# Patient Record
Sex: Female | Born: 1975 | Race: White | Hispanic: No | Marital: Married | Smoking: Never smoker
Health system: Southern US, Community
[De-identification: ages and names within clinical notes are randomized; demographics above are authoritative.]

## PROBLEM LIST (undated history)

## (undated) DIAGNOSIS — E782 Mixed hyperlipidemia: Secondary | ICD-10-CM

## (undated) DIAGNOSIS — Z8719 Personal history of other diseases of the digestive system: Secondary | ICD-10-CM

## (undated) DIAGNOSIS — Z8742 Personal history of other diseases of the female genital tract: Secondary | ICD-10-CM

## (undated) DIAGNOSIS — F419 Anxiety disorder, unspecified: Secondary | ICD-10-CM

## (undated) DIAGNOSIS — T7840XA Allergy, unspecified, initial encounter: Secondary | ICD-10-CM

## (undated) DIAGNOSIS — K219 Gastro-esophageal reflux disease without esophagitis: Secondary | ICD-10-CM

## (undated) DIAGNOSIS — Z789 Other specified health status: Secondary | ICD-10-CM

## (undated) DIAGNOSIS — I341 Nonrheumatic mitral (valve) prolapse: Secondary | ICD-10-CM

## (undated) DIAGNOSIS — R011 Cardiac murmur, unspecified: Secondary | ICD-10-CM

## (undated) HISTORY — DX: Gastro-esophageal reflux disease without esophagitis: K21.9

## (undated) HISTORY — PX: WISDOM TOOTH EXTRACTION: SHX21

## (undated) HISTORY — DX: Anxiety disorder, unspecified: F41.9

## (undated) HISTORY — DX: Cardiac murmur, unspecified: R01.1

## (undated) HISTORY — DX: Personal history of other diseases of the female genital tract: Z87.42

## (undated) HISTORY — DX: Allergy, unspecified, initial encounter: T78.40XA

## (undated) HISTORY — DX: Mixed hyperlipidemia: E78.2

## (undated) HISTORY — DX: Personal history of other diseases of the digestive system: Z87.19

---

## 1997-11-23 ENCOUNTER — Other Ambulatory Visit: Admission: RE | Admit: 1997-11-23 | Discharge: 1997-11-23 | Payer: Self-pay | Admitting: Gynecology

## 1999-01-10 ENCOUNTER — Other Ambulatory Visit: Admission: RE | Admit: 1999-01-10 | Discharge: 1999-01-10 | Payer: Self-pay | Admitting: Gynecology

## 2000-01-25 ENCOUNTER — Other Ambulatory Visit: Admission: RE | Admit: 2000-01-25 | Discharge: 2000-01-25 | Payer: Self-pay | Admitting: Gynecology

## 2001-04-07 ENCOUNTER — Other Ambulatory Visit: Admission: RE | Admit: 2001-04-07 | Discharge: 2001-04-07 | Payer: Self-pay | Admitting: Gynecology

## 2002-04-28 ENCOUNTER — Other Ambulatory Visit: Admission: RE | Admit: 2002-04-28 | Discharge: 2002-04-28 | Payer: Self-pay | Admitting: Obstetrics and Gynecology

## 2002-05-22 ENCOUNTER — Ambulatory Visit (HOSPITAL_COMMUNITY): Admission: RE | Admit: 2002-05-22 | Discharge: 2002-05-22 | Payer: Self-pay | Admitting: Obstetrics and Gynecology

## 2002-05-22 ENCOUNTER — Encounter: Payer: Self-pay | Admitting: Obstetrics and Gynecology

## 2003-06-28 ENCOUNTER — Inpatient Hospital Stay (HOSPITAL_COMMUNITY): Admission: AD | Admit: 2003-06-28 | Discharge: 2003-06-30 | Payer: Self-pay | Admitting: Obstetrics and Gynecology

## 2003-08-10 ENCOUNTER — Other Ambulatory Visit: Admission: RE | Admit: 2003-08-10 | Discharge: 2003-08-10 | Payer: Self-pay | Admitting: Obstetrics and Gynecology

## 2004-10-20 ENCOUNTER — Other Ambulatory Visit: Admission: RE | Admit: 2004-10-20 | Discharge: 2004-10-20 | Payer: Self-pay | Admitting: Obstetrics and Gynecology

## 2006-02-22 ENCOUNTER — Inpatient Hospital Stay (HOSPITAL_COMMUNITY): Admission: AD | Admit: 2006-02-22 | Discharge: 2006-02-25 | Payer: Self-pay | Admitting: Obstetrics and Gynecology

## 2006-03-20 ENCOUNTER — Ambulatory Visit: Admission: RE | Admit: 2006-03-20 | Discharge: 2006-03-20 | Payer: Self-pay | Admitting: Obstetrics and Gynecology

## 2011-08-06 LAB — OB RESULTS CONSOLE ABO/RH: RH Type: POSITIVE

## 2011-08-06 LAB — OB RESULTS CONSOLE HIV ANTIBODY (ROUTINE TESTING): HIV: NONREACTIVE

## 2011-08-06 LAB — OB RESULTS CONSOLE HEPATITIS B SURFACE ANTIGEN: Hepatitis B Surface Ag: NEGATIVE

## 2012-02-29 ENCOUNTER — Encounter (HOSPITAL_COMMUNITY): Payer: Self-pay | Admitting: *Deleted

## 2012-02-29 ENCOUNTER — Inpatient Hospital Stay (HOSPITAL_COMMUNITY)
Admission: AD | Admit: 2012-02-29 | Discharge: 2012-03-01 | DRG: 379 | Disposition: A | Discharge status: Home / Self Care | Payer: BC Managed Care – PPO | Source: Ambulatory Visit | Attending: Obstetrics and Gynecology | Admitting: Obstetrics and Gynecology

## 2012-02-29 DIAGNOSIS — O4700 False labor before 37 completed weeks of gestation, unspecified trimester: Principal | ICD-10-CM | POA: Diagnosis present

## 2012-02-29 HISTORY — DX: Nonrheumatic mitral (valve) prolapse: I34.1

## 2012-02-29 HISTORY — DX: Other specified health status: Z78.9

## 2012-02-29 NOTE — MAU Note (Signed)
Been uncomfortable last couple days. Ctxs started about 2000.

## 2012-02-29 NOTE — MAU Note (Signed)
To recheck pt in 1.5 hr and discharge home if no change in cervix

## 2012-03-01 ENCOUNTER — Encounter (HOSPITAL_COMMUNITY): Payer: Self-pay | Admitting: *Deleted

## 2012-03-01 LAB — CBC
MCH: 28.3 pg (ref 26.0–34.0)
MCHC: 33.6 g/dL (ref 30.0–36.0)
MCV: 84.2 fL (ref 78.0–100.0)
Platelets: 157 10*3/uL (ref 150–400)
RBC: 3.99 MIL/uL (ref 3.87–5.11)
RDW: 14.5 % (ref 11.5–15.5)

## 2012-03-01 LAB — RPR: RPR Ser Ql: NONREACTIVE

## 2012-03-01 MED ORDER — LACTATED RINGERS IV SOLN: 500.0000 mL | INTRAVENOUS | Status: DC | PRN

## 2012-03-01 MED ORDER — CITRIC ACID-SODIUM CITRATE 334-500 MG/5ML PO SOLN: 30.0000 mL | ORAL | Status: DC | PRN

## 2012-03-01 MED ORDER — IBUPROFEN 600 MG PO TABS: 600.0000 mg | ORAL_TABLET | Freq: Four times a day (QID) | ORAL | Status: DC | PRN

## 2012-03-01 MED ORDER — FLEET ENEMA 7-19 GM/118ML RE ENEM: 1.0000 | ENEMA | Freq: Once | RECTAL | Status: DC

## 2012-03-01 MED ORDER — PENICILLIN G POTASSIUM 5000000 UNITS IJ SOLR
5.0000 10*6.[IU] | Freq: Once | INTRAVENOUS | Status: AC
  Administered 2012-03-01: 5 10*6.[IU] via INTRAVENOUS
  Filled 2012-03-01: qty 5

## 2012-03-01 MED ORDER — OXYTOCIN BOLUS FROM INFUSION: 500.0000 mL | INTRAVENOUS | Status: DC

## 2012-03-01 MED ORDER — OXYCODONE-ACETAMINOPHEN 5-325 MG PO TABS: 1.0000 | ORAL_TABLET | ORAL | Status: DC | PRN

## 2012-03-01 MED ORDER — ONDANSETRON HCL 4 MG/2ML IJ SOLN: 4.0000 mg | Freq: Four times a day (QID) | INTRAMUSCULAR | Status: DC | PRN

## 2012-03-01 MED ORDER — PENICILLIN G POTASSIUM 5000000 UNITS IJ SOLR
2.5000 10*6.[IU] | INTRAMUSCULAR | Status: DC
  Administered 2012-03-01: 2.5 10*6.[IU] via INTRAVENOUS
  Filled 2012-03-01 (×3): qty 2.5

## 2012-03-01 MED ORDER — LIDOCAINE HCL (PF) 1 % IJ SOLN: 30.0000 mL | INTRAMUSCULAR | Status: DC | PRN

## 2012-03-01 MED ORDER — ACETAMINOPHEN 325 MG PO TABS: 650.0000 mg | ORAL_TABLET | ORAL | Status: DC | PRN

## 2012-03-01 MED ORDER — OXYTOCIN 40 UNITS IN LACTATED RINGERS INFUSION - SIMPLE MED: 62.5000 mL/h | INTRAVENOUS | Status: DC

## 2012-03-01 MED ORDER — LACTATED RINGERS IV SOLN
INTRAVENOUS | Status: DC
  Administered 2012-03-01: 02:00:00 via INTRAVENOUS

## 2012-03-01 NOTE — Plan of Care (Signed)
Problem: Consults Goal: Birthing Suites Patient Information Press F2 to bring up selections list Outcome: Completed/Met Date Met:  03/01/12  Pt < [redacted] weeks EGA  Comments:  36.4 wks

## 2012-03-01 NOTE — H&P (Signed)
36 yo G3P2 @ 36+4 wks presents w/ c/o contractions.  No lof or vb.  Good FM.  Pregnancy uncomplicated.  Past History - see hollister  AF, VSS Gen - NAD Abd - gravid, NT Ext - NT, no edema Cvx 2/80/-2 - unchanged x 8 hours  A/P:  Latent labor Discharge home, labor precautions given

## 2012-03-13 ENCOUNTER — Inpatient Hospital Stay (HOSPITAL_COMMUNITY)
Admission: AD | Admit: 2012-03-13 | Discharge: 2012-03-15 | DRG: 373 | Disposition: A | Discharge status: Home / Self Care | Payer: BC Managed Care – PPO | Source: Ambulatory Visit | Attending: Obstetrics and Gynecology | Admitting: Obstetrics and Gynecology

## 2012-03-13 DIAGNOSIS — O09529 Supervision of elderly multigravida, unspecified trimester: Secondary | ICD-10-CM | POA: Diagnosis present

## 2012-03-13 MED ORDER — OXYTOCIN 10 UNIT/ML IJ SOLN
10.0000 [IU] | Freq: Once | INTRAMUSCULAR | Status: AC
  Administered 2012-03-13: 10 [IU] via INTRAMUSCULAR
  Filled 2012-03-13: qty 1

## 2012-03-13 MED ORDER — ACETAMINOPHEN 325 MG PO TABS: 650.0000 mg | ORAL_TABLET | ORAL | Status: DC | PRN

## 2012-03-13 MED ORDER — LACTATED RINGERS IV SOLN: INTRAVENOUS | Status: DC

## 2012-03-13 MED ORDER — FLEET ENEMA 7-19 GM/118ML RE ENEM: 1.0000 | ENEMA | Freq: Once | RECTAL | Status: DC

## 2012-03-13 MED ORDER — CITRIC ACID-SODIUM CITRATE 334-500 MG/5ML PO SOLN: 30.0000 mL | ORAL | Status: DC | PRN

## 2012-03-13 MED ORDER — OXYTOCIN 40 UNITS IN LACTATED RINGERS INFUSION - SIMPLE MED
62.5000 mL/h | INTRAVENOUS | Status: DC
  Filled 2012-03-13: qty 1000

## 2012-03-13 MED ORDER — LACTATED RINGERS IV SOLN: 500.0000 mL | INTRAVENOUS | Status: DC | PRN

## 2012-03-13 MED ORDER — OXYTOCIN BOLUS FROM INFUSION: 500.0000 mL | INTRAVENOUS | Status: DC

## 2012-03-13 MED ORDER — IBUPROFEN 600 MG PO TABS: 600.0000 mg | ORAL_TABLET | Freq: Four times a day (QID) | ORAL | Status: DC | PRN

## 2012-03-13 MED ORDER — OXYCODONE-ACETAMINOPHEN 5-325 MG PO TABS: 1.0000 | ORAL_TABLET | ORAL | Status: DC | PRN

## 2012-03-13 MED ORDER — ONDANSETRON HCL 4 MG/2ML IJ SOLN: 4.0000 mg | Freq: Four times a day (QID) | INTRAMUSCULAR | Status: DC | PRN

## 2012-03-13 MED ORDER — LIDOCAINE HCL (PF) 1 % IJ SOLN
30.0000 mL | INTRAMUSCULAR | Status: DC | PRN
  Administered 2012-03-13: 30 mL via SUBCUTANEOUS
  Filled 2012-03-13: qty 30

## 2012-03-13 NOTE — Progress Notes (Signed)
Pt tearful.

## 2012-03-13 NOTE — MAU Note (Signed)
Pt states she felt her water break about 2130

## 2012-03-13 NOTE — H&P (Signed)
36 yo G3P2 @ 38+2 weeks presents in active labor.  C/O ctx and LOF.  No VB.  Pregnancy uncomplicated.  PMHx:  See [facility], GBS neg  AF, VSS + FHT Toco Q2 Gen uncomfortable w/ ctx Cvx - 4-5cm in MAU  Pt progressed quickly to c/c/+2 station.  Upon my arrival to LDR, infant being delivered by CNM.   I delivered placenta, spontaneous w/ 3VC.  IM pitocin given.  2nd degree laceration repaired with 2-0 vicryl rapide. Lidocaine used for local anesthesia.  EBL 450cc. Mom and baby stable in LDR

## 2012-03-14 ENCOUNTER — Encounter (HOSPITAL_COMMUNITY): Payer: Self-pay | Admitting: *Deleted

## 2012-03-14 LAB — CBC
HCT: 31.8 % — ABNORMAL LOW (ref 36.0–46.0)
Hemoglobin: 10.7 g/dL — ABNORMAL LOW (ref 12.0–15.0)
MCH: 28.1 pg (ref 26.0–34.0)
MCHC: 33.6 g/dL (ref 30.0–36.0)
MCHC: 33.7 g/dL (ref 30.0–36.0)
MCV: 83.3 fL (ref 78.0–100.0)
Platelets: 144 10*3/uL — ABNORMAL LOW (ref 150–400)
RDW: 14.6 % (ref 11.5–15.5)
RDW: 14.6 % (ref 11.5–15.5)
WBC: 16.9 10*3/uL — ABNORMAL HIGH (ref 4.0–10.5)

## 2012-03-14 LAB — RPR: RPR Ser Ql: NONREACTIVE

## 2012-03-14 MED ORDER — ONDANSETRON HCL 4 MG/2ML IJ SOLN: 4.0000 mg | INTRAMUSCULAR | Status: DC | PRN

## 2012-03-14 MED ORDER — SIMETHICONE 80 MG PO CHEW: 80.0000 mg | CHEWABLE_TABLET | ORAL | Status: DC | PRN

## 2012-03-14 MED ORDER — MEASLES, MUMPS & RUBELLA VAC ~~LOC~~ INJ
0.5000 mL | INJECTION | Freq: Once | SUBCUTANEOUS | Status: DC
  Filled 2012-03-14: qty 0.5

## 2012-03-14 MED ORDER — METHYLERGONOVINE MALEATE 0.2 MG PO TABS
0.2000 mg | ORAL_TABLET | Freq: Three times a day (TID) | ORAL | Status: DC
  Administered 2012-03-14 – 2012-03-15 (×4): 0.2 mg via ORAL
  Filled 2012-03-14 (×4): qty 1

## 2012-03-14 MED ORDER — OXYCODONE-ACETAMINOPHEN 5-325 MG PO TABS: 1.0000 | ORAL_TABLET | ORAL | Status: DC | PRN

## 2012-03-14 MED ORDER — BENZOCAINE-MENTHOL 20-0.5 % EX AERO: 1.0000 "application " | INHALATION_SPRAY | CUTANEOUS | Status: DC | PRN

## 2012-03-14 MED ORDER — ONDANSETRON HCL 4 MG PO TABS: 4.0000 mg | ORAL_TABLET | ORAL | Status: DC | PRN

## 2012-03-14 MED ORDER — LANOLIN HYDROUS EX OINT: TOPICAL_OINTMENT | CUTANEOUS | Status: DC | PRN

## 2012-03-14 MED ORDER — DIBUCAINE 1 % RE OINT: 1.0000 "application " | TOPICAL_OINTMENT | RECTAL | Status: DC | PRN

## 2012-03-14 MED ORDER — SENNOSIDES-DOCUSATE SODIUM 8.6-50 MG PO TABS
2.0000 | ORAL_TABLET | Freq: Every day | ORAL | Status: DC
  Administered 2012-03-14 (×2): 2 via ORAL

## 2012-03-14 MED ORDER — TETANUS-DIPHTH-ACELL PERTUSSIS 5-2.5-18.5 LF-MCG/0.5 IM SUSP: 0.5000 mL | Freq: Once | INTRAMUSCULAR | Status: DC

## 2012-03-14 MED ORDER — IBUPROFEN 600 MG PO TABS
600.0000 mg | ORAL_TABLET | Freq: Four times a day (QID) | ORAL | Status: DC
  Administered 2012-03-14 – 2012-03-15 (×6): 600 mg via ORAL
  Filled 2012-03-14 (×6): qty 1

## 2012-03-14 MED ORDER — WITCH HAZEL-GLYCERIN EX PADS: 1.0000 "application " | MEDICATED_PAD | CUTANEOUS | Status: DC | PRN

## 2012-03-14 MED ORDER — MEDROXYPROGESTERONE ACETATE 150 MG/ML IM SUSP: 150.0000 mg | INTRAMUSCULAR | Status: DC | PRN

## 2012-03-14 MED ORDER — DIPHENHYDRAMINE HCL 25 MG PO CAPS: 25.0000 mg | ORAL_CAPSULE | Freq: Four times a day (QID) | ORAL | Status: DC | PRN

## 2012-03-14 MED ORDER — PRENATAL MULTIVITAMIN CH
1.0000 | ORAL_TABLET | Freq: Every day | ORAL | Status: DC
  Administered 2012-03-14: 1 via ORAL
  Filled 2012-03-14 (×2): qty 1

## 2012-03-14 NOTE — Progress Notes (Signed)
Post Partum Day 1 Subjective: no complaints, up ad lib, voiding and tolerating PO.  Wants circ prior to D/C.  Counseled re: r/b/a.    Objective: Blood pressure 105/65, pulse 87, temperature 98.5 F (36.9 C), temperature source Oral, resp. rate 16, SpO2 96.00%, unknown if currently breastfeeding.  Physical Exam:  General: alert, cooperative and appears stated age Lochia: appropriate Uterine Fundus: firm Incision: n/a DVT Evaluation: No evidence of DVT seen on physical exam. Negative Homan's sign. No cords or calf tenderness.   Basename 03/14/12 0513 03/14/12 0010  HGB 10.7* 11.1*  HCT 31.8* 32.9*    Assessment/Plan: Plan for discharge tomorrow, Breastfeeding and Circumcision prior to discharge   LOS: 1 day   Alyssa Young 03/14/2012, 8:16 AM

## 2012-03-15 ENCOUNTER — Encounter (HOSPITAL_COMMUNITY): Payer: Self-pay

## 2012-03-15 MED ORDER — IBUPROFEN 600 MG PO TABS: 600.0000 mg | ORAL_TABLET | Freq: Four times a day (QID) | ORAL | Status: DC

## 2012-03-15 MED ORDER — OXYCODONE-ACETAMINOPHEN 5-325 MG PO TABS: 1.0000 | ORAL_TABLET | ORAL | Status: DC | PRN

## 2012-03-15 NOTE — Discharge Summary (Signed)
Obstetric Discharge Summary Reason for Admission: onset of labor Prenatal Procedures: none Intrapartum Procedures: spontaneous vaginal delivery Postpartum Procedures: none Complications-Operative and Postpartum: none Hemoglobin  Date Value Range Status  03/14/2012 10.7* 12.0 - 15.0 g/dL Final     HCT  Date Value Range Status  03/14/2012 31.8* 36.0 - 46.0 % Final    Physical Exam:  General: alert, cooperative and appears stated age Lochia: appropriate Uterine Fundus: firm Incision: n/a DVT Evaluation: No evidence of DVT seen on physical exam. Negative Homan's sign. No cords or calf tenderness.  Discharge Diagnoses: Term Pregnancy-delivered  Discharge Information: Date: 03/15/2012 Activity: pelvic rest Diet: routine Medications: PNV, Ibuprofen, Colace and Percocet Condition: stable Instructions: refer to practice specific booklet Discharge to: home   Newborn Data: Live born female  Birth Weight: 8 lb 1.1 oz (3660 g) APGAR: 8, 9  Home with mother.  Margot Oriordan 03/15/2012, 9:17 AM

## 2012-03-15 NOTE — Progress Notes (Signed)
Post Partum Day 2 Subjective: no complaints, up ad lib, voiding and tolerating PO  Objective: Blood pressure 103/57, pulse 81, temperature 98.2 F (36.8 C), temperature source Oral, resp. rate 18, SpO2 100.00%, unknown if currently breastfeeding.  Physical Exam:  General: alert, cooperative and appears stated age Lochia: appropriate Uterine Fundus: firm Incision: n/a DVT Evaluation: No evidence of DVT seen on physical exam. Negative Homan's sign. No cords or calf tenderness.   Basename 03/14/12 0513 03/14/12 0010  HGB 10.7* 11.1*  HCT 31.8* 32.9*    Assessment/Plan: Discharge home, Breastfeeding and Circumcision prior to discharge   LOS: 2 days   Alyssa Young 03/15/2012, 9:13 AM

## 2012-03-19 ENCOUNTER — Ambulatory Visit (HOSPITAL_COMMUNITY)
Admission: RE | Admit: 2012-03-19 | Discharge: 2012-03-19 | Disposition: A | Discharge status: Home / Self Care | Payer: BC Managed Care – PPO | Source: Ambulatory Visit | Attending: Obstetrics and Gynecology | Admitting: Obstetrics and Gynecology

## 2012-03-19 NOTE — Progress Notes (Signed)
Adult Lactation Consultation Outpatient Visit Note  Patient Name: Alyssa Young(mother)  BABY: Alyssa Young Date of Birth: 08-Jan-1976                                      DOB: [birth year] Gestational Age at Delivery: [redacted]w[redacted]d                   BIRTH WEIGHT: 8-1 Type of Delivery: NVD                                        DISCHARGE WEIGHT: 7-12.9                                                                             WEIGHT TODAY: 7-9.5 Breastfeeding History: Frequency of Breastfeeding: EVERY 1-5 HOURS Length of Feeding: 20-60 MINUTES ONE OR BOTH BREASTS Voids: 5 Stools: 2 TRANSITIONAL  Supplementing / Method 2.5 oz of formula and 1-2 oz of EBM PER BOTTLE in past 24 hours Pumping:  Type of Pump:PUMP IN STYLE   Frequency:EVERY 3 HOURS  Volume:  30 MLS TOTAL  Comments:    Consultation Evaluation: Mom and 6 day old baby her with c/o engorgement x 3 days which mom reports started improving some last night.  Mom states breasts became very hard and painful.  Mom used ice x 2.  Breasts are still engorged today.  Ice packs applied to both breasts and assisted with latching baby and using good breast massage and compression during feeding.  Baby was able to transfer 26 mls.  Instructed mom on importance of using ice packs every 1 1/2- 2 hours x 20 minutes until swelling subsides.  Also recommended taking ibuprofen as directed and post pumping with DEBP until engorgement resolves.  Mom to give any EBM back to baby.  Mom is an experience breastfeeder and will call office if no improvement.  Initial Feeding Assessment:30 MINUTES BOTH RIGHT AND LEFT BREAST Pre-feed [id number]:0454 Post-feed [id number]:1478 Amount Transferred:26 MLS Comments:  Additional Feeding Assessment:N/A Pre-feed Weight: Post-feed Weight: Amount Transferred: Comments:  Additional Feeding Assessment:N/A Pre-feed Weight: Post-feed Weight: Amount Transferred: Comments:  Total Breast milk Transferred this Visit: 26  MLS Total Supplement Given: 20 MLS OF EBM  Additional Interventions:   Follow-Up WILL CALL [facility] OFFICE PRN      Hansel Feinstein 03/19/2012, 9:56 AM

## 2012-03-24 ENCOUNTER — Ambulatory Visit (HOSPITAL_COMMUNITY): Payer: BC Managed Care – PPO

## 2012-03-24 NOTE — Discharge Summary (Signed)
Obstetric Discharge Summary **Late Entry** Reason for Admission: onset of labor Prenatal Procedures: none Intrapartum Procedures: spontaneous vaginal delivery Postpartum Procedures: none Complications-Operative and Postpartum: none Hemoglobin  Date Value Range Status  03/14/2012 10.7* 12.0 - 15.0 g/dL Final     HCT  Date Value Range Status  03/14/2012 31.8* 36.0 - 46.0 % Final    Physical Exam:  General: alert, cooperative and appears stated age Lochia: appropriate Uterine Fundus: firm Incision: healing well DVT Evaluation: No evidence of DVT seen on physical exam. Negative Homan's sign. No cords or calf tenderness.  Discharge Diagnoses: Term Pregnancy-delivered  Discharge Information: Date: 03/24/2012 Activity: pelvic rest Diet: routine Medications: PNV, Ibuprofen, Colace, Iron and Percocet Condition: stable Instructions: refer to practice specific booklet Discharge to: home Follow-up Information    Follow up with ADKINS,GRETCHEN, MD. Schedule an appointment as soon as possible for a visit in 3 days. (as scheduled)    Contact information:   [address] [place] [address] [place] [address] [phone]          Newborn Data: This patient has no babies on file. Home with mother.  Jrue Jarriel 03/24/2012, 8:15 PM

## 2013-07-20 ENCOUNTER — Encounter: Payer: Self-pay | Admitting: *Deleted

## 2013-07-20 DIAGNOSIS — K589 Irritable bowel syndrome without diarrhea: Secondary | ICD-10-CM | POA: Insufficient documentation

## 2013-07-20 DIAGNOSIS — Z8742 Personal history of other diseases of the female genital tract: Secondary | ICD-10-CM | POA: Insufficient documentation

## 2013-07-20 DIAGNOSIS — Z8719 Personal history of other diseases of the digestive system: Secondary | ICD-10-CM | POA: Insufficient documentation

## 2013-07-22 ENCOUNTER — Ambulatory Visit (INDEPENDENT_AMBULATORY_CARE_PROVIDER_SITE_OTHER): Payer: BC Managed Care – PPO | Admitting: Emergency Medicine

## 2013-07-22 ENCOUNTER — Encounter: Payer: Self-pay | Admitting: Emergency Medicine

## 2013-07-22 VITALS — BP 106/60 | HR 70 | Temp 98.2°F | Resp 16 | Ht 65.25 in | Wt 131.0 lb

## 2013-07-22 DIAGNOSIS — E559 Vitamin D deficiency, unspecified: Secondary | ICD-10-CM

## 2013-07-22 DIAGNOSIS — Z1212 Encounter for screening for malignant neoplasm of rectum: Secondary | ICD-10-CM

## 2013-07-22 DIAGNOSIS — E782 Mixed hyperlipidemia: Secondary | ICD-10-CM

## 2013-07-22 DIAGNOSIS — Z0000 Encounter for general adult medical examination without abnormal findings: Secondary | ICD-10-CM

## 2013-07-22 DIAGNOSIS — Z79899 Other long term (current) drug therapy: Secondary | ICD-10-CM

## 2013-07-22 LAB — CBC WITH DIFFERENTIAL/PLATELET
BASOS ABS: 0 10*3/uL (ref 0.0–0.1)
Basophils Relative: 1 % (ref 0–1)
EOS ABS: 0.1 10*3/uL (ref 0.0–0.7)
Eosinophils Relative: 3 % (ref 0–5)
HCT: 40.8 % (ref 36.0–46.0)
Hemoglobin: 14.1 g/dL (ref 12.0–15.0)
LYMPHS ABS: 0.9 10*3/uL (ref 0.7–4.0)
LYMPHS PCT: 32 % (ref 12–46)
MCH: 29.3 pg (ref 26.0–34.0)
MCHC: 34.6 g/dL (ref 30.0–36.0)
MCV: 84.8 fL (ref 78.0–100.0)
Monocytes Absolute: 0.3 10*3/uL (ref 0.1–1.0)
Monocytes Relative: 12 % (ref 3–12)
NEUTROS PCT: 52 % (ref 43–77)
Neutro Abs: 1.5 10*3/uL — ABNORMAL LOW (ref 1.7–7.7)
PLATELETS: 160 10*3/uL (ref 150–400)
RBC: 4.81 MIL/uL (ref 3.87–5.11)
RDW: 13.9 % (ref 11.5–15.5)
WBC: 2.9 10*3/uL — AB (ref 4.0–10.5)

## 2013-07-22 LAB — HEMOGLOBIN A1C
HEMOGLOBIN A1C: 5.4 % (ref <5.7)
Mean Plasma Glucose: 108 mg/dL (ref <117)

## 2013-07-22 MED ORDER — HYOSCYAMINE SULFATE 0.125 MG SL SUBL: 0.1250 mg | SUBLINGUAL_TABLET | SUBLINGUAL | Status: DC | PRN

## 2013-07-22 NOTE — Patient Instructions (Signed)
Allergic Rhinitis Allergic rhinitis is when the mucous membranes in the nose respond to allergens. Allergens are particles in the air that cause your body to have an allergic reaction. This causes you to release allergic antibodies. Through a chain of events, these eventually cause you to release histamine into the blood stream. Although meant to protect the body, it is this release of histamine that causes your discomfort, such as frequent sneezing, congestion, and an itchy, runny nose.  CAUSES  Seasonal allergic rhinitis (hay fever) is caused by pollen allergens that may come from grasses, trees, and weeds. Year-round allergic rhinitis (perennial allergic rhinitis) is caused by allergens such as house dust mites, pet dander, and mold spores.  SYMPTOMS   Nasal stuffiness (congestion).  Itchy, runny nose with sneezing and tearing of the eyes. DIAGNOSIS  Your health care provider can help you determine the allergen or allergens that trigger your symptoms. If you and your health care provider are unable to determine the allergen, skin or blood testing may be used. TREATMENT  Allergic Rhinitis does not have a cure, but it can be controlled by:  Medicines and allergy shots (immunotherapy).  Avoiding the allergen. Hay fever may often be treated with antihistamines in pill or nasal spray forms. Antihistamines block the effects of histamine. There are over-the-counter medicines that may help with nasal congestion and swelling around the eyes. Check with your health care provider before taking or giving this medicine.  If avoiding the allergen or the medicine prescribed do not work, there are many new medicines your health care provider can prescribe. Stronger medicine may be used if initial measures are ineffective. Desensitizing injections can be used if medicine and avoidance does not work. Desensitization is when a patient is given ongoing shots until the body becomes less sensitive to the allergen.  Make sure you follow up with your health care provider if problems continue. HOME CARE INSTRUCTIONS It is not possible to completely avoid allergens, but you can reduce your symptoms by taking steps to limit your exposure to them. It helps to know exactly what you are allergic to so that you can avoid your specific triggers. SEEK MEDICAL CARE IF:   You have a fever.  You develop a cough that does not stop easily (persistent).  You have shortness of breath.  You start wheezing.  Symptoms interfere with normal daily activities. Document Released: 12/26/2000 Document Revised: 01/21/2013 Document Reviewed: 12/08/2012 [facility] Patient Information 2014 [place], [place]. Diet and Irritable Bowel Syndrome  No cure has been found for irritable bowel syndrome (IBS). Many options are available to treat the symptoms. Your caregiver will give you the best treatments available for your symptoms. He or she will also encourage you to manage stress and to make changes to your diet. You need to work with your caregiver and Registered Dietician to find the best combination of medicine, diet, counseling, and support to control your symptoms. The following are some diet suggestions. FOODS THAT MAKE IBS WORSE  Fatty foods, such as [place] fries.  Milk products, such as cheese or ice cream.  Chocolate.  Alcohol.  Caffeine (found in coffee and some sodas).  Carbonated drinks, such as soda. If certain foods cause symptoms, you should eat less of them or stop eating them. FOOD JOURNAL   Keep a journal of the foods that seem to cause distress. Write down:  What you are eating during the day and when.  What problems you are having after eating.  When the symptoms occur  in relation to your meals.  What foods always make you feel badly.  Take your notes with you to your caregiver to see if you should stop eating certain foods. FOODS THAT MAKE IBS BETTER Fiber reduces IBS symptoms, especially  constipation, because it makes stools soft, bulky, and easier to pass. Fiber is found in bran, bread, cereal, beans, fruit, and vegetables. Examples of foods with fiber include:  Apples.  Peaches.  Pears.  Berries.  Figs.  Broccoli, raw.  Cabbage.  Carrots.  Raw peas.  Kidney beans.  Lima beans.  Whole-grain bread.  Whole-grain cereal. Add foods with fiber to your diet a little at a time. This will let your body get used to them. Too much fiber at once might cause gas and swelling of your abdomen. This can trigger symptoms in a person with IBS. Caregivers usually recommend a diet with enough fiber to produce soft, painless bowel movements. High fiber diets may cause gas and bloating. However, these symptoms often go away within a few weeks, as your body adjusts. In many cases, dietary fiber may lessen IBS symptoms, particularly constipation. However, it may not help pain or diarrhea. High fiber diets keep the colon mildly enlarged (distended) with the added fiber. This may help prevent spasms in the colon. Some forms of fiber also keep water in the stool, thereby preventing hard stools that are difficult to pass.  Besides telling you to eat more foods with fiber, your caregiver may also tell you to get more fiber by taking a fiber pill or drinking water mixed with a special high fiber powder. An example of this is a natural fiber laxative containing psyllium seed.  TIPS  Large meals can cause cramping and diarrhea in people with IBS. If this happens to you, try eating 4 or 5 small meals a day, or try eating less at each of your usual 3 meals. It may also help if your meals are low in fat and high in carbohydrates. Examples of carbohydrates are pasta, rice, whole-grain breads and cereals, fruits, and vegetables.  If dairy products cause your symptoms to flare up, you can try eating less of those foods. You might be able to handle yogurt better than other dairy products, because it  contains bacteria that helps with digestion. Dairy products are an important source of calcium and other nutrients. If you need to avoid dairy products, be sure to talk with a Registered Dietitian about getting these nutrients through other food sources.  Drink enough water and fluids to keep your urine clear or pale yellow. This is important, especially if you have diarrhea. FOR MORE INFORMATION  International Foundation for Functional Gastrointestinal Disorders: www.iffgd.org  National Digestive Diseases Information Clearinghouse: digestive.StageSync.si Document Released: 06/23/2003 Document Revised: 06/25/2011 Document Reviewed: 03/10/2007 [facility] Patient Information 2014 [place], [place]. Irritable Bowel Syndrome Irritable Bowel Syndrome (IBS) is caused by a disturbance of normal bowel function. Other terms used are spastic colon, mucous colitis, and irritable colon. It does not require surgery, nor does it lead to cancer. There is no cure for IBS. But with proper diet, stress reduction, and medication, you will find that your problems (symptoms) will gradually disappear or improve. IBS is a common digestive disorder. It usually appears in late adolescence or early adulthood. Women develop it twice as often as men. CAUSES  After food has been digested and absorbed in the small intestine, waste material is moved into the colon (large intestine). In the colon, water and salts are absorbed from  the undigested products coming from the small intestine. The remaining residue, or fecal material, is held for elimination. Under normal circumstances, gentle, rhythmic contractions on the bowel walls push the fecal material along the colon towards the rectum. In IBS, however, these contractions are irregular and poorly coordinated. The fecal material is either retained too long, resulting in constipation, or expelled too soon, producing diarrhea. SYMPTOMS  The most common symptom of IBS is pain. It is  typically in the lower left side of the belly (abdomen). But it may occur anywhere in the abdomen. It can be felt as heartburn, backache, or even as a dull pain in the arms or shoulders. The pain comes from excessive bowel-muscle spasms and from the buildup of gas and fecal material in the colon. This pain:  Can range from sharp belly (abdominal) cramps to a dull, continuous ache.  Usually worsens soon after eating.  Is typically relieved by having a bowel movement or passing gas. Abdominal pain is usually accompanied by constipation. But it may also produce diarrhea. The diarrhea typically occurs right after a meal or upon arising in the morning. The stools are typically soft and watery. They are often flecked with secretions (mucus). Other symptoms of IBS include:  Bloating.  Loss of appetite.  Heartburn.  Feeling sick to your stomach (nausea).  Belching  Vomiting  Gas. IBS may also cause a number of symptoms that are unrelated to the digestive system:  Fatigue.  Headaches.  Anxiety  Shortness of breath  Difficulty in concentrating.  Dizziness. These symptoms tend to come and go. DIAGNOSIS  The symptoms of IBS closely mimic the symptoms of other, more serious digestive disorders. So your caregiver may wish to perform a variety of additional tests to exclude these disorders. He/she wants to be certain of learning what is wrong (diagnosis). The nature and purpose of each test will be explained to you. TREATMENT A number of medications are available to help correct bowel function and/or relieve bowel spasms and abdominal pain. Among the drugs available are:  Mild, non-irritating laxatives for severe constipation and to help restore normal bowel habits.  Specific anti-diarrheal medications to treat severe or prolonged diarrhea.  Anti-spasmodic agents to relieve intestinal cramps.  Your caregiver may also decide to treat you with a mild tranquilizer or sedative during  unusually stressful periods in your life. The important thing to remember is that if any drug is prescribed for you, make sure that you take it exactly as directed. Make sure that your caregiver knows how well it worked for you. HOME CARE INSTRUCTIONS   Avoid foods that are high in fat or oils. Some examples ZOX:WRUEAare:heavy cream, butter, frankfurters, sausage, and other fatty meats.  Avoid foods that have a laxative effect, such as fruit, fruit juice, and dairy products.  Cut out carbonated drinks, chewing gum, and "gassy" foods, such as beans and cabbage. This may help relieve bloating and belching.  Bran taken with plenty of liquids may help relieve constipation.  Keep track of what foods seem to trigger your symptoms.  Avoid emotionally charged situations or circumstances that produce anxiety.  Start or continue exercising.  Get plenty of rest and sleep. MAKE SURE YOU:   Understand these instructions.  Will watch your condition.  Will get help right away if you are not doing well or get worse. Document Released: 04/02/2005 Document Revised: 06/25/2011 Document Reviewed: 11/21/2007 [facility] Patient Information 2014 [place].

## 2013-07-23 LAB — BASIC METABOLIC PANEL WITH GFR
BUN: 10 mg/dL (ref 6–23)
CHLORIDE: 103 meq/L (ref 96–112)
CO2: 28 meq/L (ref 19–32)
Calcium: 9.4 mg/dL (ref 8.4–10.5)
Creat: 0.71 mg/dL (ref 0.50–1.10)
GFR, Est African American: >89 mL/min
GFR, Est Non African American: >89 mL/min
Glucose, Bld: 86 mg/dL (ref 70–99)
POTASSIUM: 4.1 meq/L (ref 3.5–5.3)
SODIUM: 138 meq/L (ref 135–145)

## 2013-07-23 LAB — URINALYSIS, ROUTINE W REFLEX MICROSCOPIC
Bilirubin Urine: NEGATIVE
GLUCOSE, UA: NEGATIVE mg/dL
HGB URINE DIPSTICK: NEGATIVE
KETONES UR: NEGATIVE mg/dL
LEUKOCYTES UA: NEGATIVE
Nitrite: NEGATIVE
PROTEIN: NEGATIVE mg/dL
Specific Gravity, Urine: 1.021 (ref 1.005–1.030)
Urobilinogen, UA: 0.2 mg/dL (ref 0.0–1.0)
pH: 7.5 (ref 5.0–8.0)

## 2013-07-23 LAB — HEPATIC FUNCTION PANEL
ALK PHOS: 61 U/L (ref 39–117)
ALT: 17 U/L (ref 0–35)
AST: 19 U/L (ref 0–37)
Albumin: 4.7 g/dL (ref 3.5–5.2)
BILIRUBIN INDIRECT: 0.3 mg/dL (ref 0.2–1.2)
Bilirubin, Direct: 0.1 mg/dL (ref 0.0–0.3)
TOTAL PROTEIN: 6.8 g/dL (ref 6.0–8.3)
Total Bilirubin: 0.4 mg/dL (ref 0.2–1.2)

## 2013-07-23 LAB — LIPID PANEL
Cholesterol: 181 mg/dL (ref 0–200)
HDL: 45 mg/dL (ref >39)
LDL CALC: 115 mg/dL — AB (ref 0–99)
Total CHOL/HDL Ratio: 4 Ratio
Triglycerides: 103 mg/dL (ref <150)
VLDL: 21 mg/dL (ref 0–40)

## 2013-07-23 LAB — TSH: TSH: 1.909 u[IU]/mL (ref 0.350–4.500)

## 2013-07-23 LAB — FOLATE RBC: RBC FOLATE: 401 ng/mL (ref >280)

## 2013-07-23 LAB — IRON AND TIBC
%SAT: 23 % (ref 20–55)
Iron: 86 ug/dL (ref 42–145)
TIBC: 371 ug/dL (ref 250–470)
UIBC: 285 ug/dL (ref 125–400)

## 2013-07-23 LAB — VITAMIN B12: Vitamin B-12: 722 pg/mL (ref 211–911)

## 2013-07-23 LAB — INSULIN, FASTING: INSULIN FASTING, SERUM: 12 u[IU]/mL (ref 3–28)

## 2013-07-23 LAB — VITAMIN D 25 HYDROXY (VIT D DEFICIENCY, FRACTURES): Vit D, 25-Hydroxy: 23 ng/mL — ABNORMAL LOW (ref 30–89)

## 2013-07-23 LAB — MAGNESIUM: Magnesium: 1.9 mg/dL (ref 1.5–2.5)

## 2013-07-23 NOTE — Progress Notes (Signed)
Subjective:    Patient ID: Alyssa Young, female    DOB: 12/09/1975, 38 y.o.   MRN: [id number]  HPI Comments: 38 yo female CPE. She is doing well overall. She has noticed increase in IBS symptoms with increased stress with work and around cycles. She has 2 flares in the last month. She denies blood in stool. She notes bland diet helps.  She has had increased allergy drainage but notes it is clear.   She has had elevated cholesterol in the past and did not f/u AD for recheck. She notes she is eating healthy and exercising routinely. Last labs T 226 LDL 157 A1C 5.6 D 27  No current outpatient prescriptions on file prior to visit.   No current facility-administered medications on file prior to visit.   Allergies  Allergen Reactions  . Allegra [Fexofenadine Hcl]    Past Medical History  Diagnosis Date  . Mitral prolapse     takes no anrtibiotics with dental work  . No pertinent past medical history   . History of PCOS   . History of IBS    Past Surgical History  Procedure Laterality Date  . Wisdom tooth extraction     History  Substance Use Topics  . Smoking status: Never Smoker   . Smokeless tobacco: Never Used  . Alcohol Use: No   Family History  Problem Relation Age of Onset  . Diabetes Mother   . Hyperlipidemia Mother   . Cancer Father     leukemia  . Leukemia Father    MAINTENANCE: Colonoscopy:n/a Mammo:n/a BMD:n/a Pap/ Pelvic:3/9/ 15 wnl EYE:2014 wnl Dentist:q yearly WNL  IMMUNIZATIONS: Tdap:12/2011 Pneumovax:n/a Zostavax:n/a Influenza:Patient declines   Patient Care Team: Lucky CowboyWilliam McKeown, MD as PCP - General (Internal Medicine) Jeani HawkingMichelle L Grewal, MD as Consulting Physician (Obstetrics and Gynecology) Elmon ElseKaren Gould, MD as Consulting Physician (Dermatology) Manning CharityJason Gould, OD as Referring Physician (Optometry)  Review of Systems  Gastrointestinal: Positive for diarrhea and constipation.  Psychiatric/Behavioral: The patient is nervous/anxious.    All other systems reviewed and are negative.  BP 106/60  Pulse 70  Temp(Src) 98.2 F (36.8 C) (Temporal)  Resp 16  Ht 5' 5.25" (1.657 m)  Wt 131 lb (59.421 kg)  BMI 21.64 kg/m2  LMP 07/17/2013     Objective:   Physical Exam  Nursing note and vitals reviewed. Constitutional: She is oriented to person, place, and time. She appears well-developed and well-nourished. No distress.  HENT:  Head: Normocephalic and atraumatic.  Right Ear: External ear normal.  Left Ear: External ear normal.  Nose: Nose normal.  Mouth/Throat: Oropharynx is clear and moist.  Eyes: Conjunctivae and EOM are normal. Pupils are equal, round, and reactive to light. Right eye exhibits no discharge. Left eye exhibits no discharge. No scleral icterus.  Neck: Normal range of motion. Neck supple. No JVD present. No tracheal deviation present. No thyromegaly present.  Cardiovascular: Normal rate, regular rhythm, normal heart sounds and intact distal pulses.   Pulmonary/Chest: Effort normal and breath sounds normal.  Abdominal: Soft. Bowel sounds are normal. She exhibits no distension and no mass. There is no tenderness. There is no rebound and no guarding.  Genitourinary:  dEF gyn  Musculoskeletal: Normal range of motion. She exhibits no edema and no tenderness.  Lymphadenopathy:    She has no cervical adenopathy.  Neurological: She is alert and oriented to person, place, and time. She has normal reflexes. No cranial nerve deficit. She exhibits normal muscle tone. Coordination normal.  Skin: Skin is  warm and dry. No rash noted. No erythema. No pallor.  LOW BACK 4 MM DARK FLAT MILD IRREG NEVI RIGHT ARM DARK FLAT MILD IRREG NEVI   Psychiatric: She has a normal mood and affect. Her behavior is normal. Judgment and thought content normal.   EKG NSCSPT WNL     Assessment & Plan:  1.CPE- Update screening labs/ History/ Immunizations/ Testing as needed. Advised healthy diet, QD exercise, increase H20 and  continue RX/ Vitamins AD.  2. IBS- Bland diet if symptoms continue, try Probiotics or refer GI, try to decrease stress  3.  Irreg Nevi- monitor for any change, call if occurs for removal  4. Cholesterol- recheck labs, Need to eat healthier and exercise AD.

## 2013-07-24 ENCOUNTER — Telehealth: Payer: Self-pay

## 2013-07-24 ENCOUNTER — Other Ambulatory Visit: Payer: Self-pay | Admitting: Emergency Medicine

## 2013-07-24 MED ORDER — AZITHROMYCIN 250 MG PO TABS: ORAL_TABLET | ORAL | Status: AC

## 2013-07-24 NOTE — Telephone Encounter (Signed)
[facility] [address] 

## 2013-07-24 NOTE — Telephone Encounter (Signed)
Pt called c/o increased sinus symptoms. Can you call in Rx

## 2013-07-27 ENCOUNTER — Encounter: Payer: Self-pay | Admitting: Emergency Medicine

## 2013-07-27 DIAGNOSIS — E782 Mixed hyperlipidemia: Secondary | ICD-10-CM | POA: Insufficient documentation

## 2013-07-27 HISTORY — DX: Mixed hyperlipidemia: E78.2

## 2013-08-24 ENCOUNTER — Other Ambulatory Visit (INDEPENDENT_AMBULATORY_CARE_PROVIDER_SITE_OTHER): Payer: BC Managed Care – PPO

## 2013-08-24 DIAGNOSIS — R6889 Other general symptoms and signs: Secondary | ICD-10-CM

## 2013-08-24 LAB — CBC WITH DIFFERENTIAL/PLATELET
BASOS ABS: 0 10*3/uL (ref 0.0–0.1)
Basophils Relative: 0 % (ref 0–1)
Eosinophils Absolute: 0.1 10*3/uL (ref 0.0–0.7)
Eosinophils Relative: 2 % (ref 0–5)
HCT: 39.9 % (ref 36.0–46.0)
Hemoglobin: 13.7 g/dL (ref 12.0–15.0)
LYMPHS ABS: 1.1 10*3/uL (ref 0.7–4.0)
LYMPHS PCT: 33 % (ref 12–46)
MCH: 29.8 pg (ref 26.0–34.0)
MCHC: 34.3 g/dL (ref 30.0–36.0)
MCV: 86.7 fL (ref 78.0–100.0)
Monocytes Absolute: 0.3 10*3/uL (ref 0.1–1.0)
Monocytes Relative: 9 % (ref 3–12)
NEUTROS ABS: 1.9 10*3/uL (ref 1.7–7.7)
NEUTROS PCT: 56 % (ref 43–77)
PLATELETS: 159 10*3/uL (ref 150–400)
RBC: 4.6 MIL/uL (ref 3.87–5.11)
RDW: 13.9 % (ref 11.5–15.5)
WBC: 3.4 10*3/uL — AB (ref 4.0–10.5)

## 2013-08-25 LAB — VARICELLA ZOSTER ANTIBODY, IGG: VARICELLA IGG: 986.1 {index} — AB (ref <135.00)

## 2013-08-25 LAB — HEPATITIS B SURFACE ANTIBODY, QUANTITATIVE: HEPATITIS B-POST: 38.8 m[IU]/mL

## 2014-02-15 ENCOUNTER — Encounter: Payer: Self-pay | Admitting: Emergency Medicine

## 2014-07-26 ENCOUNTER — Encounter: Payer: Self-pay | Admitting: Emergency Medicine

## 2014-07-29 ENCOUNTER — Ambulatory Visit (INDEPENDENT_AMBULATORY_CARE_PROVIDER_SITE_OTHER): Payer: BLUE CROSS/BLUE SHIELD | Admitting: Emergency Medicine

## 2014-07-29 ENCOUNTER — Encounter: Payer: Self-pay | Admitting: Emergency Medicine

## 2014-07-29 VITALS — BP 108/66 | HR 76 | Temp 98.2°F | Resp 16 | Ht 65.25 in | Wt 138.0 lb

## 2014-07-29 DIAGNOSIS — Z79899 Other long term (current) drug therapy: Secondary | ICD-10-CM

## 2014-07-29 DIAGNOSIS — Z130 Encounter for screening for diseases of the blood and blood-forming organs and certain disorders involving the immune mechanism: Secondary | ICD-10-CM

## 2014-07-29 DIAGNOSIS — Z0000 Encounter for general adult medical examination without abnormal findings: Secondary | ICD-10-CM

## 2014-07-29 DIAGNOSIS — Z131 Encounter for screening for diabetes mellitus: Secondary | ICD-10-CM

## 2014-07-29 DIAGNOSIS — Z1329 Encounter for screening for other suspected endocrine disorder: Secondary | ICD-10-CM

## 2014-07-29 DIAGNOSIS — Z8249 Family history of ischemic heart disease and other diseases of the circulatory system: Secondary | ICD-10-CM

## 2014-07-29 DIAGNOSIS — Z139 Encounter for screening, unspecified: Secondary | ICD-10-CM

## 2014-07-29 DIAGNOSIS — E559 Vitamin D deficiency, unspecified: Secondary | ICD-10-CM

## 2014-07-29 DIAGNOSIS — E782 Mixed hyperlipidemia: Secondary | ICD-10-CM

## 2014-07-29 DIAGNOSIS — Z13228 Encounter for screening for other metabolic disorders: Secondary | ICD-10-CM

## 2014-07-29 DIAGNOSIS — I10 Essential (primary) hypertension: Secondary | ICD-10-CM

## 2014-07-29 LAB — CBC WITH DIFFERENTIAL/PLATELET
BASOS ABS: 0 10*3/uL (ref 0.0–0.1)
Basophils Relative: 1 % (ref 0–1)
Eosinophils Absolute: 0 10*3/uL (ref 0.0–0.7)
Eosinophils Relative: 1 % (ref 0–5)
HCT: 41.3 % (ref 36.0–46.0)
Hemoglobin: 14 g/dL (ref 12.0–15.0)
LYMPHS PCT: 33 % (ref 12–46)
Lymphs Abs: 1.3 10*3/uL (ref 0.7–4.0)
MCH: 29.6 pg (ref 26.0–34.0)
MCHC: 33.9 g/dL (ref 30.0–36.0)
MCV: 87.3 fL (ref 78.0–100.0)
MPV: 11.6 fL (ref 8.6–12.4)
Monocytes Absolute: 0.3 10*3/uL (ref 0.1–1.0)
Monocytes Relative: 8 % (ref 3–12)
NEUTROS ABS: 2.2 10*3/uL (ref 1.7–7.7)
Neutrophils Relative %: 57 % (ref 43–77)
PLATELETS: 191 10*3/uL (ref 150–400)
RBC: 4.73 MIL/uL (ref 3.87–5.11)
RDW: 13.6 % (ref 11.5–15.5)
WBC: 3.9 10*3/uL — AB (ref 4.0–10.5)

## 2014-07-29 MED ORDER — HYOSCYAMINE SULFATE 0.125 MG SL SUBL: 0.1250 mg | SUBLINGUAL_TABLET | SUBLINGUAL | Status: DC | PRN

## 2014-07-29 NOTE — Patient Instructions (Signed)
Fat and Cholesterol Control Diet Fat and cholesterol levels in your blood and organs are influenced by your diet. High levels of fat and cholesterol may lead to diseases of the heart, small and large blood vessels, gallbladder, liver, and pancreas. CONTROLLING FAT AND CHOLESTEROL WITH DIET Although exercise and lifestyle factors are important, your diet is key. That is because certain foods are known to raise cholesterol and others to lower it. The goal is to balance foods for their effect on cholesterol and more importantly, to replace saturated and trans fat with other types of fat, such as monounsaturated fat, polyunsaturated fat, and omega-3 fatty acids. On average, a person should consume no more than 15 to 17 g of saturated fat daily. Saturated and trans fats are considered "bad" fats, and they will raise LDL cholesterol. Saturated fats are primarily found in animal products such as meats, butter, and cream. However, that does not mean you need to give up all your favorite foods. Today, there are good tasting, low-fat, low-cholesterol substitutes for most of the things you like to eat. Choose low-fat or nonfat alternatives. Choose round or loin cuts of red meat. These types of cuts are lowest in fat and cholesterol. Chicken (without the skin), fish, veal, and ground turkey breast are great choices. Eliminate fatty meats, such as hot dogs and salami. Even shellfish have little or no saturated fat. Have a 3 oz (85 g) portion when you eat lean meat, poultry, or fish. Trans fats are also called "partially hydrogenated oils." They are oils that have been scientifically manipulated so that they are solid at room temperature resulting in a longer shelf life and improved taste and texture of foods in which they are added. Trans fats are found in stick margarine, some tub margarines, cookies, crackers, and baked goods.  When baking and cooking, oils are a great substitute for butter. The monounsaturated oils are  especially beneficial since it is believed they lower LDL and raise HDL. The oils you should avoid entirely are saturated tropical oils, such as coconut and palm.  Remember to eat a lot from food groups that are naturally free of saturated and trans fat, including fish, fruit, vegetables, beans, grains (barley, rice, couscous, bulgur wheat), and pasta (without cream sauces).  IDENTIFYING FOODS THAT LOWER FAT AND CHOLESTEROL  Soluble fiber may lower your cholesterol. This type of fiber is found in fruits such as apples, vegetables such as broccoli, potatoes, and carrots, legumes such as beans, peas, and lentils, and grains such as barley. Foods fortified with plant sterols (phytosterol) may also lower cholesterol. You should eat at least 2 g per day of these foods for a cholesterol lowering effect.  Read package labels to identify low-saturated fats, trans fat free, and low-fat foods at the supermarket. Select cheeses that have only 2 to 3 g saturated fat per ounce. Use a heart-healthy tub margarine that is free of trans fats or partially hydrogenated oil. When buying baked goods (cookies, crackers), avoid partially hydrogenated oils. Breads and muffins should be made from whole grains (whole-wheat or whole oat flour, instead of "flour" or "enriched flour"). Buy non-creamy canned soups with reduced salt and no added fats.  FOOD PREPARATION TECHNIQUES  Never deep-fry. If you must fry, either stir-fry, which uses very little fat, or use non-stick cooking sprays. When possible, broil, bake, or roast meats, and steam vegetables. Instead of putting butter or margarine on vegetables, use lemon and herbs, applesauce, and cinnamon (for squash and sweet potatoes). Use nonfat   yogurt, salsa, and low-fat dressings for salads.  LOW-SATURATED FAT / LOW-FAT FOOD SUBSTITUTES Meats / Saturated Fat (g)  Avoid: Steak, marbled (3 oz/85 g) / 11 g  Choose: Steak, lean (3 oz/85 g) / 4 g  Avoid: Hamburger (3 oz/85 g) / 7  g  Choose: Hamburger, lean (3 oz/85 g) / 5 g  Avoid: Ham (3 oz/85 g) / 6 g  Choose: Ham, lean cut (3 oz/85 g) / 2.4 g  Avoid: Chicken, with skin, dark meat (3 oz/85 g) / 4 g  Choose: Chicken, skin removed, dark meat (3 oz/85 g) / 2 g  Avoid: Chicken, with skin, light meat (3 oz/85 g) / 2.5 g  Choose: Chicken, skin removed, light meat (3 oz/85 g) / 1 g Dairy / Saturated Fat (g)  Avoid: Whole milk (1 cup) / 5 g  Choose: Low-fat milk, 2% (1 cup) / 3 g  Choose: Low-fat milk, 1% (1 cup) / 1.5 g  Choose: Skim milk (1 cup) / 0.3 g  Avoid: Hard cheese (1 oz/28 g) / 6 g  Choose: Skim milk cheese (1 oz/28 g) / 2 to 3 g  Avoid: Cottage cheese, 4% fat (1 cup) / 6.5 g  Choose: Low-fat cottage cheese, 1% fat (1 cup) / 1.5 g  Avoid: Ice cream (1 cup) / 9 g  Choose: Sherbet (1 cup) / 2.5 g  Choose: Nonfat frozen yogurt (1 cup) / 0.3 g  Choose: Frozen fruit bar / trace  Avoid: Whipped cream (1 tbs) / 3.5 g  Choose: Nondairy whipped topping (1 tbs) / 1 g Condiments / Saturated Fat (g)  Avoid: Mayonnaise (1 tbs) / 2 g  Choose: Low-fat mayonnaise (1 tbs) / 1 g  Avoid: Butter (1 tbs) / 7 g  Choose: Extra light margarine (1 tbs) / 1 g  Avoid: Coconut oil (1 tbs) / 11.8 g  Choose: Olive oil (1 tbs) / 1.8 g  Choose: Corn oil (1 tbs) / 1.7 g  Choose: Safflower oil (1 tbs) / 1.2 g  Choose: Sunflower oil (1 tbs) / 1.4 g  Choose: Soybean oil (1 tbs) / 2.4 g  Choose: Canola oil (1 tbs) / 1 g Document Released: 04/02/2005 Document Revised: 07/28/2012 Document Reviewed: 07/01/2013 [employer] Patient Information 2015 [employer]. This information is not intended to replace advice given to you by your health care provider. Make sure you discuss any questions you have with your health care provider.  

## 2014-07-29 NOTE — Progress Notes (Signed)
Subjective:    Patient ID: Alyssa Young, female    DOB: 30-Dec-1975, 39 y.o.   MRN: [id number]  HPI Comments: 39 yo WF CPE and cholesterol follow up. She had declined prescription cholesterol medicine in the past and prefers to try natural if labs still elevated. She is eating healthy. She keeps active but denies cardio. Denies CV complaints. + FHX cardiovascular disease.  She notes bowel cramping occasionally diarrhea usually around cycles with IBS hx. She uses hyoscyamine PRN. She denies need for QD IBS prescription and notes manages with diet and deceased stress.  Claritin used to manage allergy drainage but has had increased symptoms over last week.  She is feeling well overall and rare fatigue. She notes fatigue resolves with rest but she is concerned with FHX Leukemia in father with WBC mildly abnormal at last lab.  Lab Results      Component                Value               Date                      WBC                      3.4*                08/24/2013                HGB                      13.7                08/24/2013                HCT                      39.9                08/24/2013                PLT                      159                 08/24/2013                GLUCOSE                  86                  07/22/2013                CHOL                     181                 07/22/2013                TRIG                     103                 07/22/2013                HDL  45                  07/22/2013                LDLCALC                  115*                07/22/2013                ALT                      17                  07/22/2013                AST                      19                  07/22/2013                NA                       138                 07/22/2013                K                        4.1                 07/22/2013                CL                       103                 07/22/2013            CREATININE               0.71                07/22/2013                BUN                      10                  07/22/2013                CO2                      28                  07/22/2013                TSH                      1.909               07/22/2013                HGBA1C                   5.4  07/22/2013                Medication List       This list is accurate as of: 07/29/14 10:05 AM.  Always use your most recent med list.               CLARITIN PO  Take by mouth as needed.     hyoscyamine 0.125 MG SL tablet  Commonly known as:  LEVSIN SL  Place 1 tablet (0.125 mg total) under the tongue every 4 (four) hours as needed.       Allergies  Allergen Reactions  . Allegra [Fexofenadine Hcl]    Past Medical History  Diagnosis Date  . Mitral prolapse     takes no anrtibiotics with dental work  . No pertinent past medical history   . History of PCOS   . History of IBS   . Mixed hyperlipidemia 07/27/2013   Past Surgical History  Procedure Laterality Date  . Wisdom tooth extraction     History  Substance Use Topics  . Smoking status: Never Smoker   . Smokeless tobacco: Never Used  . Alcohol Use: No   Family History  Problem Relation Age of Onset  . Diabetes Mother   . Hyperlipidemia Mother   . Cancer Father     leukemia  . Leukemia Father   . Cancer Maternal Grandfather     prostate  . Stroke Maternal Grandfather   . Cancer Paternal Grandmother    MAINTENANCE: Colonoscopy:n/a Mammo:n/a BMD:n/a Pap/ Pelvic:WNL 2015 due 08/11/14 EYE:2015 WNL Dentist:Q 6 month 03/2014 LMP: 07/21/14 HIV: NEG 2014 @ hospital RPR: NEG 2014 @ Hospital   IMMUNIZATIONS: Tdap:2013 Pneumovax:n/a Zostavax:n/a Influenza:2015 @ work HEP B series Completed per patient   Patient Care Team: Lucky CowboyWilliam McKeown, MD as PCP - General (Internal Medicine) Marcelle OverlieMichelle Grewal, MD as Consulting Physician (Obstetrics and Gynecology) Elmon ElseKaren Gould, MD as Consulting  Physician (Dermatology) Manning CharityJason Gould, OD as Referring Physician (Optometry) Rowe, (Dentist)  Review of Systems  Constitutional: Negative for fever, fatigue and unexpected weight change.  HENT: Positive for postnasal drip.   Respiratory: Negative for cough and shortness of breath.   Cardiovascular: Negative for chest pain.  Gastrointestinal: Negative for abdominal pain.  Genitourinary: Negative for difficulty urinating.  Musculoskeletal: Negative for arthralgias.  All other systems reviewed and are negative.  BP 108/66 mmHg  Pulse 76  Temp(Src) 98.2 F (36.8 C) (Temporal)  Resp 16  Ht 5' 5.25" (1.657 m)  Wt 138 lb (62.596 kg)  BMI 22.80 kg/m2  LMP 07/21/2014     Objective:   Physical Exam  Constitutional: She is oriented to person, place, and time. She appears well-developed and well-nourished. No distress.  HENT:  Head: Normocephalic.  Nose: Nose normal.  Mouth/Throat: Oropharynx is clear and moist.  Cloudy TM's bilaterally   Eyes: Conjunctivae and EOM are normal. Pupils are equal, round, and reactive to light. No scleral icterus.  Neck: Normal range of motion. Neck supple. No JVD present. No tracheal deviation present. No thyromegaly present.  Cardiovascular: Normal rate, regular rhythm, normal heart sounds and intact distal pulses.   Pulmonary/Chest: Effort normal and breath sounds normal.  Abdominal: Soft. Bowel sounds are normal. She exhibits no distension and no mass. There is no tenderness.  Genitourinary:  DEF GYN 2016 Breast WNL  Musculoskeletal: Normal range of motion. She exhibits no edema or tenderness.  Lymphadenopathy:    She has no cervical adenopathy.  Neurological: She is alert and oriented to person, place,  and time. She has normal reflexes. No cranial nerve deficit. She exhibits normal muscle tone. Coordination normal.  Skin: Skin is warm and dry. No rash noted. No erythema.     Psychiatric: She has a normal mood and affect. Her behavior is normal.  Judgment and thought content normal.  Nursing note and vitals reviewed.     EKG NSCSPT WNL     Assessment & Plan:  1. CPE- Update screening labs/ History/ Immunizations/ Testing as needed. Advised healthy diet, QD exercise, increase H20 and continue RX/ Vitamins AD.   2. History of abnormal CBC with FHX of Leukemia in father-IF CBC low will refer to Hematology  3. Cholesterol- recheck labs, Need to eat healthier and exercise AD. Will try herbals if elevated but close recheck and will need RX if no improvement to decrease risk, patient awareof risk.  4. IBS- Controlled with diet/ decrease stress, with occasionally Hyoscyamine. Add Probiotic, if any increased symptoms needs further evaluation  5. Allergic rhinitis- Claritin/ Allegra OTC, increase H2o, allergy hygiene explained. W/c if needs RX Flonase.   6.  Irreg Nevi- monitor for any change, call if occurs for removal

## 2014-07-30 LAB — URINALYSIS, ROUTINE W REFLEX MICROSCOPIC
Bilirubin Urine: NEGATIVE
GLUCOSE, UA: NEGATIVE mg/dL
HGB URINE DIPSTICK: NEGATIVE
KETONES UR: NEGATIVE mg/dL
Leukocytes, UA: NEGATIVE
Nitrite: NEGATIVE
PH: 6.5 (ref 5.0–8.0)
PROTEIN: NEGATIVE mg/dL
Specific Gravity, Urine: 1.025 (ref 1.005–1.030)
Urobilinogen, UA: 0.2 mg/dL (ref 0.0–1.0)

## 2014-07-30 LAB — BASIC METABOLIC PANEL WITH GFR
BUN: 12 mg/dL (ref 6–23)
CHLORIDE: 104 meq/L (ref 96–112)
CO2: 25 meq/L (ref 19–32)
CREATININE: 0.74 mg/dL (ref 0.50–1.10)
Calcium: 9.1 mg/dL (ref 8.4–10.5)
GFR, Est African American: >89 mL/min
GFR, Est Non African American: >89 mL/min
Glucose, Bld: 77 mg/dL (ref 70–99)
POTASSIUM: 3.8 meq/L (ref 3.5–5.3)
Sodium: 140 mEq/L (ref 135–145)

## 2014-07-30 LAB — HEMOGLOBIN A1C
HEMOGLOBIN A1C: 5.6 % (ref <5.7)
MEAN PLASMA GLUCOSE: 114 mg/dL (ref <117)

## 2014-07-30 LAB — HEPATIC FUNCTION PANEL
ALBUMIN: 4.9 g/dL (ref 3.5–5.2)
ALT: 26 U/L (ref 0–35)
AST: 23 U/L (ref 0–37)
Alkaline Phosphatase: 56 U/L (ref 39–117)
BILIRUBIN TOTAL: 0.5 mg/dL (ref 0.2–1.2)
Bilirubin, Direct: 0.1 mg/dL (ref 0.0–0.3)
Indirect Bilirubin: 0.4 mg/dL (ref 0.2–1.2)
Total Protein: 7.3 g/dL (ref 6.0–8.3)

## 2014-07-30 LAB — MAGNESIUM: Magnesium: 2.2 mg/dL (ref 1.5–2.5)

## 2014-07-30 LAB — TSH: TSH: 1.785 u[IU]/mL (ref 0.350–4.500)

## 2014-07-30 LAB — LIPID PANEL
CHOL/HDL RATIO: 4.6 ratio
Cholesterol: 217 mg/dL — ABNORMAL HIGH (ref 0–200)
HDL: 47 mg/dL (ref >=46)
LDL CALC: 152 mg/dL — AB (ref 0–99)
Triglycerides: 90 mg/dL (ref <150)
VLDL: 18 mg/dL (ref 0–40)

## 2014-07-30 LAB — INSULIN, FASTING: INSULIN FASTING, SERUM: 4.4 u[IU]/mL (ref 2.0–19.6)

## 2014-07-30 LAB — VITAMIN D 25 HYDROXY (VIT D DEFICIENCY, FRACTURES): VIT D 25 HYDROXY: 20 ng/mL — AB (ref 30–100)

## 2014-08-03 ENCOUNTER — Encounter: Payer: Self-pay | Admitting: *Deleted

## 2015-08-05 ENCOUNTER — Encounter: Payer: Self-pay | Admitting: Internal Medicine

## 2015-08-05 ENCOUNTER — Ambulatory Visit (INDEPENDENT_AMBULATORY_CARE_PROVIDER_SITE_OTHER): Payer: BLUE CROSS/BLUE SHIELD | Admitting: Internal Medicine

## 2015-08-05 VITALS — BP 104/68 | HR 72 | Temp 98.0°F | Resp 16 | Ht 65.25 in | Wt 131.0 lb

## 2015-08-05 DIAGNOSIS — N6310 Unspecified lump in the right breast, unspecified quadrant: Secondary | ICD-10-CM

## 2015-08-05 DIAGNOSIS — Z111 Encounter for screening for respiratory tuberculosis: Secondary | ICD-10-CM

## 2015-08-05 DIAGNOSIS — I10 Essential (primary) hypertension: Secondary | ICD-10-CM

## 2015-08-05 DIAGNOSIS — Z0000 Encounter for general adult medical examination without abnormal findings: Secondary | ICD-10-CM | POA: Diagnosis not present

## 2015-08-05 DIAGNOSIS — Z79899 Other long term (current) drug therapy: Secondary | ICD-10-CM | POA: Diagnosis not present

## 2015-08-05 DIAGNOSIS — E782 Mixed hyperlipidemia: Secondary | ICD-10-CM

## 2015-08-05 DIAGNOSIS — E559 Vitamin D deficiency, unspecified: Secondary | ICD-10-CM | POA: Diagnosis not present

## 2015-08-05 DIAGNOSIS — Z1389 Encounter for screening for other disorder: Secondary | ICD-10-CM

## 2015-08-05 DIAGNOSIS — Z130 Encounter for screening for diseases of the blood and blood-forming organs and certain disorders involving the immune mechanism: Secondary | ICD-10-CM

## 2015-08-05 DIAGNOSIS — Z136 Encounter for screening for cardiovascular disorders: Secondary | ICD-10-CM

## 2015-08-05 DIAGNOSIS — Z131 Encounter for screening for diabetes mellitus: Secondary | ICD-10-CM

## 2015-08-05 DIAGNOSIS — Z1329 Encounter for screening for other suspected endocrine disorder: Secondary | ICD-10-CM

## 2015-08-05 LAB — BASIC METABOLIC PANEL WITH GFR
BUN: 12 mg/dL (ref 7–25)
CHLORIDE: 105 mmol/L (ref 98–110)
CO2: 27 mmol/L (ref 20–31)
Calcium: 9 mg/dL (ref 8.6–10.2)
Creat: 0.84 mg/dL (ref 0.50–1.10)
GFR, EST NON AFRICAN AMERICAN: 88 mL/min (ref >=60)
GLUCOSE: 83 mg/dL (ref 65–99)
POTASSIUM: 3.7 mmol/L (ref 3.5–5.3)
Sodium: 141 mmol/L (ref 135–146)

## 2015-08-05 LAB — CBC WITH DIFFERENTIAL/PLATELET
BASOS PCT: 0 %
Basophils Absolute: 0 cells/uL (ref 0–200)
Eosinophils Absolute: 46 cells/uL (ref 15–500)
Eosinophils Relative: 1 %
HEMATOCRIT: 39.9 % (ref 35.0–45.0)
HEMOGLOBIN: 13.2 g/dL (ref 11.7–15.5)
LYMPHS ABS: 1242 {cells}/uL (ref 850–3900)
Lymphocytes Relative: 27 %
MCH: 29.2 pg (ref 27.0–33.0)
MCHC: 33.1 g/dL (ref 32.0–36.0)
MCV: 88.3 fL (ref 80.0–100.0)
MONO ABS: 368 {cells}/uL (ref 200–950)
MPV: 11.8 fL (ref 7.5–12.5)
Monocytes Relative: 8 %
NEUTROS ABS: 2944 {cells}/uL (ref 1500–7800)
Neutrophils Relative %: 64 %
Platelets: 176 10*3/uL (ref 140–400)
RBC: 4.52 MIL/uL (ref 3.80–5.10)
RDW: 13.5 % (ref 11.0–15.0)
WBC: 4.6 10*3/uL (ref 3.8–10.8)

## 2015-08-05 LAB — HEPATIC FUNCTION PANEL
ALK PHOS: 49 U/L (ref 33–115)
ALT: 23 U/L (ref 6–29)
AST: 21 U/L (ref 10–30)
Albumin: 4.7 g/dL (ref 3.6–5.1)
BILIRUBIN INDIRECT: 0.4 mg/dL (ref 0.2–1.2)
Bilirubin, Direct: 0.1 mg/dL (ref <=0.2)
TOTAL PROTEIN: 6.9 g/dL (ref 6.1–8.1)
Total Bilirubin: 0.5 mg/dL (ref 0.2–1.2)

## 2015-08-05 LAB — URINALYSIS, ROUTINE W REFLEX MICROSCOPIC
Bilirubin Urine: NEGATIVE
GLUCOSE, UA: NEGATIVE
Hgb urine dipstick: NEGATIVE
Ketones, ur: NEGATIVE
LEUKOCYTES UA: NEGATIVE
Nitrite: NEGATIVE
PROTEIN: NEGATIVE
Specific Gravity, Urine: 1.023 (ref 1.001–1.035)
pH: 8 (ref 5.0–8.0)

## 2015-08-05 LAB — HEMOGLOBIN A1C
Hgb A1c MFr Bld: 5.3 % (ref <5.7)
Mean Plasma Glucose: 105 mg/dL

## 2015-08-05 LAB — LIPID PANEL
Cholesterol: 193 mg/dL (ref 125–200)
HDL: 53 mg/dL (ref >=46)
LDL CALC: 125 mg/dL (ref <130)
TRIGLYCERIDES: 73 mg/dL (ref <150)
Total CHOL/HDL Ratio: 3.6 Ratio (ref <=5.0)
VLDL: 15 mg/dL (ref <30)

## 2015-08-05 LAB — VITAMIN B12: VITAMIN B 12: 540 pg/mL (ref 200–1100)

## 2015-08-05 LAB — IRON AND TIBC
%SAT: 24 % (ref 11–50)
Iron: 93 ug/dL (ref 40–190)
TIBC: 383 ug/dL (ref 250–450)
UIBC: 290 ug/dL (ref 125–400)

## 2015-08-05 LAB — TSH: TSH: 2.1 mIU/L

## 2015-08-05 LAB — MAGNESIUM: Magnesium: 2 mg/dL (ref 1.5–2.5)

## 2015-08-05 MED ORDER — HYOSCYAMINE SULFATE 0.125 MG SL SUBL: 0.1250 mg | SUBLINGUAL_TABLET | SUBLINGUAL | Status: DC | PRN

## 2015-08-05 NOTE — Progress Notes (Signed)
Annual Screening Comprehensive Examination   This very nice 40 y.o.female presents for complete physical.  Patient has history of IBS and PCOS.  Patient reports no complaints at this time.   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    She does see Obgyn.  She sees Dr. Adalberto Ill with [facility].  She has not been yet this year.  She does feel like there have been some changes in her right breast.  She does find that the texture changes with her period.  She has never had a regular period.  She reports that they can be anywhere from 4-6 weeks.  She has a history of PCOS.  No heavy bleeding or bad cramping.    Finally, patient has history of Vitamin D Deficiency and last vitamin D was  Lab Results  Component Value Date   VD25OH 20* 07/29/2014  .  Currently on supplementation  She does need a ppd today.  She did get a flu shot on November 3rd.    No changes in family history.     Current Outpatient Prescriptions on File Prior to Visit  Medication Sig Dispense Refill  . hyoscyamine (LEVSIN SL) 0.125 MG SL tablet Place 1 tablet (0.125 mg total) under the tongue every 4 (four) hours as needed. 60 tablet 2  . Loratadine (CLARITIN PO) Take by mouth as needed.     No current facility-administered medications on file prior to visit.    Allergies  Allergen Reactions  . Allegra [Fexofenadine Hcl]     Past Medical History  Diagnosis Date  . Mitral prolapse     takes no anrtibiotics with dental work  . No pertinent past medical history   . History of PCOS   . History of IBS   . Mixed hyperlipidemia 07/27/2013    Immunization History  Administered Date(s) Administered  . Td 10/06/2001  . Tdap 01/15/2012    Past Surgical History  Procedure Laterality Date  . Wisdom tooth extraction      Family History  Problem Relation Age of Onset  . Diabetes Mother   . Hyperlipidemia Mother   . Cancer Father     leukemia  . Leukemia Father   . Cancer Maternal  Grandfather     prostate  . Stroke Maternal Grandfather   . Cancer Paternal Grandmother     Social History   Social History  . Marital Status: Married    Spouse Name: N/A  . Number of Children: N/A  . Years of Education: N/A   Occupational History  . Not on file.   Social History Main Topics  . Smoking status: Never Smoker   . Smokeless tobacco: Never Used  . Alcohol Use: No  . Drug Use: No  . Sexual Activity: Yes    Birth Control/ Protection: None   Other Topics Concern  . Not on file   Social History Narrative   Review of Systems  Constitutional: Negative for fever, chills and malaise/fatigue.  HENT: Positive for congestion. Negative for ear pain and sore throat.   Eyes: Negative for blurred vision, double vision and redness.  Respiratory: Negative for cough, shortness of breath and wheezing.   Cardiovascular: Negative for chest pain, palpitations and leg swelling.  Gastrointestinal: Negative for heartburn, abdominal pain, diarrhea, constipation, blood in stool and melena.  Genitourinary: Negative.   Musculoskeletal: Negative.   Skin: Negative.   Neurological: Negative for dizziness, sensory change, loss of consciousness and headaches.  Psychiatric/Behavioral: Negative for depression and suicidal ideas. The patient is not nervous/anxious and does not have insomnia.     Physical Exam  BP 104/68 mmHg  Pulse 72  Temp(Src) 98 F (36.7 C) (Temporal)  Resp 16  Ht 5' 5.25" (1.657 m)  Wt 131 lb (59.421 kg)  BMI 21.64 kg/m2  LMP 07/12/2015  General Appearance: Well nourished and in no apparent distress. Eyes: PERRLA, EOMs, conjunctiva no swelling or erythema, normal fundi and vessels. Sinuses: No frontal/maxillary tenderness ENT/Mouth: EACs patent / TMs  nl. Nares clear without erythema, swelling, mucoid exudates. Oral hygiene is good. No erythema, swelling, or exudate. Tongue normal, non-obstructing. Tonsils not swollen or erythematous. Hearing normal.  Neck:  Supple, thyroid normal. No bruits, nodes or JVD. Respiratory: Respiratory effort normal.  BS equal and clear bilateral without rales, rhonci, wheezing or stridor. Cardio: Heart sounds are normal with regular rate and rhythm and no murmurs, rubs or gallops. Peripheral pulses are normal and equal bilaterally without edema. No aortic or femoral bruits. Chest: symmetric with normal excursions and percussion. Breasts: Symmetric, with a firm, freely mobile rounded nodule to the right breast with no axillary changes, No nipple discharge, retractions.  Bilateral fibrocystic change Abdomen: Flat, soft, with bowl sounds. Nontender, no guarding, rebound, hernias, masses, or organomegaly.  Lymphatics: Non tender without lymphadenopathy.  Genitourinary: Deferred to Obgyn Musculoskeletal: Full ROM all peripheral extremities, joint stability, 5/5 strength, and normal gait. Skin: Warm and dry without rashes, lesions, cyanosis, clubbing or  ecchymosis. 2 darkened irregular margin nevi.  0.5 mm diameter at the lumbar region of the spine Neuro: Cranial nerves intact, reflexes equal bilaterally. Normal muscle tone, no cerebellar symptoms. Sensation intact.  Pysch: Awake and oriented X 3, normal affect, Insight and Judgment appropriate.   Assessment and Plan   1. Routine general medical examination at a health care facility  - CBC with Differential/Platelet - BASIC METABOLIC PANEL WITH GFR - Hepatic function panel - Magnesium  2. Mixed hyperlipidemia  - Lipid panel  3. Screening for diabetes mellitus  - Hemoglobin A1c - Insulin, random  4. Screening for thyroid disorder  - TSH  5. Screening for hematuria or proteinuria  - Urinalysis, Routine w reflex microscopic ([facility]) - Microalbumin / creatinine urine ratio  6. Screening for deficiency anemia  - Iron and TIBC - Vitamin B12  7. Screening for cardiovascular condition  - EKG 12-Lead  8. Vitamin D deficiency  - VITAMIN D 25 Hydroxy  (Vit-D Deficiency, Fractures)  9. Breast mass, right -likely fibrocystic changes based on changes in size around period, but firm and palpable - MM Digital Diagnostic Bilat; Future  10. Screening examination for pulmonary tuberculosis -high risk due to population served at her health clinic - PPD  Continue prudent diet as discussed, weight control, regular exercise, and medications. Routine screening labs and tests as requested with regular follow-up as recommended.  Over 40 minutes of exam, counseling, chart review and critical decision making was performed

## 2015-08-06 LAB — MICROALBUMIN / CREATININE URINE RATIO
CREATININE, URINE: 210 mg/dL (ref 20–320)
MICROALB/CREAT RATIO: 2 ug/mg{creat} (ref <30)
Microalb, Ur: 0.5 mg/dL

## 2015-08-06 LAB — VITAMIN D 25 HYDROXY (VIT D DEFICIENCY, FRACTURES): VIT D 25 HYDROXY: 34 ng/mL (ref 30–100)

## 2015-08-08 LAB — INSULIN, RANDOM: Insulin: 6.8 u[IU]/mL (ref 2.0–19.6)

## 2015-08-10 ENCOUNTER — Other Ambulatory Visit: Payer: Self-pay | Admitting: Internal Medicine

## 2015-08-10 DIAGNOSIS — N6310 Unspecified lump in the right breast, unspecified quadrant: Secondary | ICD-10-CM

## 2015-08-16 ENCOUNTER — Ambulatory Visit
Admission: RE | Admit: 2015-08-16 | Discharge: 2015-08-16 | Disposition: A | Discharge status: Home / Self Care | Payer: BLUE CROSS/BLUE SHIELD | Source: Ambulatory Visit | Attending: Internal Medicine | Admitting: Internal Medicine

## 2015-08-16 ENCOUNTER — Other Ambulatory Visit: Payer: Self-pay

## 2015-08-16 DIAGNOSIS — N6310 Unspecified lump in the right breast, unspecified quadrant: Secondary | ICD-10-CM

## 2016-07-03 ENCOUNTER — Encounter: Payer: Self-pay | Admitting: Internal Medicine

## 2016-08-10 ENCOUNTER — Encounter: Payer: Self-pay | Admitting: Internal Medicine

## 2016-08-31 ENCOUNTER — Encounter: Payer: Self-pay | Admitting: Physician Assistant

## 2016-09-18 NOTE — Progress Notes (Signed)
Complete Physical  Assessment and Plan:   History of PCOS  History of IBS  Mixed hyperlipidemia -     CBC with Differential/Platelet -     BASIC METABOLIC PANEL WITH GFR -     Hepatic function panel -     TSH -     Lipid panel  Routine general medical examination at a health care facility  Screening for hematuria or proteinuria -     Urinalysis, Routine w reflex microscopic -     Microalbumin / creatinine urine ratio  Screening for thyroid disorder -     TSH  Screening for deficiency anemia -     Iron and TIBC -     Vitamin B12  Vitamin D deficiency -     VITAMIN D 25 Hydroxy (Vit-D Deficiency, Fractures)  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   Medication management -     Magnesium    Discussed med's effects and SE's. Screening labs and tests as requested with regular follow-up as recommended. Over 40 minutes of exam, counseling, chart review, and complex, high level critical decision making was performed this visit.   HPI  41 y.o. female  presents for a complete physical and follow up for has History of PCOS; History of IBS; and Mixed hyperlipidemia on her problem list..  Has history of PCOS, states thinks have cyst that ruptured over the weekend, goes to see GYN next week. Better at this time, have menses, due to start soon, last one May 6th. Husband with vasectomy.   Her blood pressure has been controlled at home, today their BP is BP: 110/68 She does workout. She denies chest pain, shortness of breath, dizziness.   She will wake up with headaches, has seen her dentist, thinks she has TMJ.   She is not on cholesterol medication and denies myalgias. Her cholesterol is at goal. The cholesterol last visit was:   Lab Results  Component Value Date   CHOL 193 08/05/2015   HDL 53 08/05/2015   LDLCALC 125  08/05/2015   TRIG 73 08/05/2015   CHOLHDL 3.6 08/05/2015    Last A1C in the office was:  Lab Results  Component Value Date   HGBA1C 5.3 08/05/2015   Last GFR: Lab Results  Component Value Date   GFRNONAA 88 08/05/2015   Patient is on Vitamin D supplement, not on vitamin D   Lab Results  Component Value Date   VD25OH 34 08/05/2015     BMI is Body mass index is 21.19 kg/m., she is working on diet and exercise. Wt Readings from Last 3 Encounters:  09/19/16 128 lb 4.8 oz (58.2 kg)  08/05/15 131 lb (59.4 kg)  07/29/14 138 lb (62.6 kg)    Current Medications:  Current Outpatient Prescriptions on File Prior to Visit  Medication Sig Dispense Refill  . Cholecalciferol (VITAMIN D) 2000 units tablet Take 2,000 Units by mouth daily.    . Loratadine (CLARITIN PO) Take by mouth as needed.     No current facility-administered medications on file prior to visit.    Allergies:  Allergies  Allergen Reactions  . Allegra [Fexofenadine Hcl]    Medical History:  She has History of PCOS; History of IBS; and Mixed hyperlipidemia on her problem list.   Health Maintenance:   Immunization History  Administered Date(s) Administered  . PPD Test 08/05/2015  .  Td 10/06/2001  . Tdap 01/15/2012   Tetanus: 2013 Pneumovax: Prevnar 13:  Flu vaccine: 2017 Zostavax:  Patient's last menstrual period was 08/19/2016. Pap: never abnormal follows GYN MGM: 08/2015, set up in July with GYN, dense tissue  DEXA: Colonoscopy: EGD:  Patient Care Team: Lucky Cowboy, MD as PCP - General (Internal Medicine) Marcelle Overlie, MD as Consulting Physician (Obstetrics and Gynecology) Elmon Else, MD as Consulting Physician (Dermatology) Manning Charity, OD as Referring Physician (Optometry)  Surgical History:  She has a past surgical history that includes Wisdom tooth extraction. Family History:  Herfamily history includes Cancer in her father, maternal grandfather, and paternal grandmother;  Diabetes in her mother; Hyperlipidemia in her mother; Leukemia in her father; Stroke in her maternal grandfather. Social History:  She reports that she has never smoked. She has never used smokeless tobacco. She reports that she does not drink alcohol or use drugs.  Review of Systems: Review of Systems  Constitutional: Negative.   HENT: Negative.   Eyes: Negative.   Respiratory: Negative.   Cardiovascular: Negative.   Gastrointestinal: Negative.   Genitourinary: Negative.   Musculoskeletal: Negative.   Skin: Negative.     Physical Exam: Estimated body mass index is 21.19 kg/m as calculated from the following:   Height as of this encounter: 5' 5.25" (1.657 m).   Weight as of this encounter: 128 lb 4.8 oz (58.2 kg). BP 110/68   Pulse 84   Temp 97.5 F (36.4 C)   Resp 16   Ht 5' 5.25" (1.657 m)   Wt 128 lb 4.8 oz (58.2 kg)   LMP 08/19/2016   SpO2 97%   BMI 21.19 kg/m  General Appearance: Well nourished, in no apparent distress.  Eyes: PERRLA, EOMs, conjunctiva no swelling or erythema, normal fundi and vessels.  Sinuses: No Frontal/maxillary tenderness  ENT/Mouth: Ext aud canals clear, normal light reflex with TMs without erythema, bulging. Good dentition. No erythema, swelling, or exudate on post pharynx. Tonsils not swollen or erythematous. Hearing normal. + TMJ, no abscess, good dentition Neck: Supple, thyroid normal. No bruits  Respiratory: Respiratory effort normal, BS equal bilaterally without rales, rhonchi, wheezing or stridor.  Cardio: RRR without murmurs, rubs or gallops. Brisk peripheral pulses without edema.  Chest: symmetric, with normal excursions and percussion.  Breasts: Symmetric, without lumps, nipple discharge, retractions.  Abdomen: Soft, nontender at this time, no guarding, rebound, hernias, masses, or organomegaly.  Lymphatics: Non tender without lymphadenopathy.  Genitourinary:  Musculoskeletal: Full ROM all peripheral extremities,5/5 strength, and  normal gait.  Skin: Warm, dry without rashes, lesions, ecchymosis. Neuro: Cranial nerves intact, reflexes equal bilaterally. Normal muscle tone, no cerebellar symptoms. Sensation intact.  Psych: Awake and oriented X 3, normal affect, Insight and Judgment appropriate.   EKG: WNL no ST changes. AORTA SCAN: WNL   Quentin Mulling 10:50 AM [facility]

## 2016-09-19 ENCOUNTER — Encounter: Payer: Self-pay | Admitting: Physician Assistant

## 2016-09-19 ENCOUNTER — Other Ambulatory Visit: Payer: Self-pay

## 2016-09-19 ENCOUNTER — Ambulatory Visit (INDEPENDENT_AMBULATORY_CARE_PROVIDER_SITE_OTHER): Payer: 59 | Admitting: Physician Assistant

## 2016-09-19 VITALS — BP 110/68 | HR 84 | Temp 97.5°F | Resp 16 | Ht 65.25 in | Wt 128.3 lb

## 2016-09-19 DIAGNOSIS — Z8742 Personal history of other diseases of the female genital tract: Secondary | ICD-10-CM

## 2016-09-19 DIAGNOSIS — Z0000 Encounter for general adult medical examination without abnormal findings: Secondary | ICD-10-CM | POA: Diagnosis not present

## 2016-09-19 DIAGNOSIS — E782 Mixed hyperlipidemia: Secondary | ICD-10-CM

## 2016-09-19 DIAGNOSIS — Z130 Encounter for screening for diseases of the blood and blood-forming organs and certain disorders involving the immune mechanism: Secondary | ICD-10-CM

## 2016-09-19 DIAGNOSIS — E559 Vitamin D deficiency, unspecified: Secondary | ICD-10-CM

## 2016-09-19 DIAGNOSIS — Z79899 Other long term (current) drug therapy: Secondary | ICD-10-CM

## 2016-09-19 DIAGNOSIS — Z1389 Encounter for screening for other disorder: Secondary | ICD-10-CM

## 2016-09-19 DIAGNOSIS — Z8719 Personal history of other diseases of the digestive system: Secondary | ICD-10-CM

## 2016-09-19 DIAGNOSIS — Z1329 Encounter for screening for other suspected endocrine disorder: Secondary | ICD-10-CM

## 2016-09-19 DIAGNOSIS — M26629 Arthralgia of temporomandibular joint, unspecified side: Secondary | ICD-10-CM

## 2016-09-19 LAB — CBC WITH DIFFERENTIAL/PLATELET
BASOS ABS: 39 {cells}/uL (ref 0–200)
Basophils Relative: 1 %
EOS ABS: 39 {cells}/uL (ref 15–500)
EOS PCT: 1 %
HCT: 37.3 % (ref 35.0–45.0)
Hemoglobin: 12.4 g/dL (ref 11.7–15.5)
LYMPHS PCT: 32 %
Lymphs Abs: 1248 cells/uL (ref 850–3900)
MCH: 29.1 pg (ref 27.0–33.0)
MCHC: 33.2 g/dL (ref 32.0–36.0)
MCV: 87.6 fL (ref 80.0–100.0)
MONOS PCT: 7 %
MPV: 12.1 fL (ref 7.5–12.5)
Monocytes Absolute: 273 cells/uL (ref 200–950)
Neutro Abs: 2301 cells/uL (ref 1500–7800)
Neutrophils Relative %: 59 %
PLATELETS: 167 10*3/uL (ref 140–400)
RBC: 4.26 MIL/uL (ref 3.80–5.10)
RDW: 13.9 % (ref 11.0–15.0)
WBC: 3.9 10*3/uL (ref 3.8–10.8)

## 2016-09-19 LAB — BASIC METABOLIC PANEL WITH GFR
BUN: 13 mg/dL (ref 7–25)
CALCIUM: 9.1 mg/dL (ref 8.6–10.2)
CO2: 25 mmol/L (ref 20–31)
CREATININE: 0.88 mg/dL (ref 0.50–1.10)
Chloride: 106 mmol/L (ref 98–110)
GFR, Est Non African American: 82 mL/min (ref >=60)
Glucose, Bld: 80 mg/dL (ref 65–99)
Potassium: 4.1 mmol/L (ref 3.5–5.3)
SODIUM: 139 mmol/L (ref 135–146)

## 2016-09-19 LAB — HEPATIC FUNCTION PANEL
ALBUMIN: 4.7 g/dL (ref 3.6–5.1)
ALT: 14 U/L (ref 6–29)
AST: 16 U/L (ref 10–30)
Alkaline Phosphatase: 51 U/L (ref 33–115)
BILIRUBIN TOTAL: 0.4 mg/dL (ref 0.2–1.2)
Bilirubin, Direct: 0.1 mg/dL (ref <=0.2)
Indirect Bilirubin: 0.3 mg/dL (ref 0.2–1.2)
Total Protein: 6.9 g/dL (ref 6.1–8.1)

## 2016-09-19 LAB — IRON AND TIBC
%SAT: 12 % (ref 11–50)
Iron: 49 ug/dL (ref 40–190)
TIBC: 412 ug/dL (ref 250–450)
UIBC: 363 ug/dL

## 2016-09-19 LAB — LIPID PANEL
CHOLESTEROL: 180 mg/dL (ref <200)
HDL: 52 mg/dL (ref >50)
LDL Cholesterol: 118 mg/dL — ABNORMAL HIGH (ref <100)
TRIGLYCERIDES: 49 mg/dL (ref <150)
Total CHOL/HDL Ratio: 3.5 Ratio (ref <5.0)
VLDL: 10 mg/dL (ref <30)

## 2016-09-19 MED ORDER — HYOSCYAMINE SULFATE 0.125 MG SL SUBL: 0.1250 mg | SUBLINGUAL_TABLET | SUBLINGUAL | 2 refills | Status: DC | PRN

## 2016-09-19 NOTE — Patient Instructions (Signed)
Vitamin D goal is between 60-80  Please make sure that you are taking your Vitamin D as directed.   It is very important as a natural anti-inflammatory   helping hair, skin, and nails, as well as reducing stroke and heart attack risk.   It helps your bones and helps with mood.  We want you on at least 5000 IU daily  It also decreases numerous cancer risks so please take it as directed.   Low Vit D is associated with a 200-300% higher risk for CANCER   and 200-300% higher risk for HEART   ATTACK  &  STROKE.    .....................................Marland Kitchen  It is also associated with higher death rate at younger ages,   autoimmune diseases like Rheumatoid arthritis, Lupus, Multiple Sclerosis.     Also many other serious conditions, like depression, Alzheimer's  Dementia, infertility, muscle aches, fatigue, fibromyalgia - just to name a few.  +++++++++++++++++++  Can get liquid vitamin D from [place]  OR here in [place] at  [facility] alternatives [address] [place], [place] [address] Or you can try earth fare    What is the TMJ? The temporomandibular (tem-PUH-ro-man-DIB-yoo-ler) joint, or the TMJ, connects the upper and lower jawbones. This joint allows the jaw to open wide and move back and forth when you chew, talk, or yawn.There are also several muscles that help this joint move. There can be muscle tightness and pain in the muscle that can cause several symptoms.  What causes TMJ pain? There are many causes of TMJ pain. Repeated chewing (for example, chewing gum) and clenching your teeth can cause pain in the joint. Some TMJ pain has no obvious cause. What can I do to ease the pain? There are many things you can do to help your pain get better. When you have pain:  Eat soft foods and stay away from chewy foods (for example, taffy) Try to use both sides of your mouth to chew Don't chew gum Massage Don't open your mouth wide (for example, during yawning or singing) Don't  bite your cheeks or fingernails Lower your amount of stress and worry Applying a warm, damp washcloth to the joint may help. Over-the-counter pain medicines such as ibuprofen (one brand: Advil) or acetaminophen (one brand: Tylenol) might also help. Do not use these medicines if you are allergic to them or if your doctor told you not to use them. How can I stop the pain from coming back? When your pain is better, you can do these exercises to make your muscles stronger and to keep the pain from coming back:  Resisted mouth opening: Place your thumb or two fingers under your chin and open your mouth slowly, pushing up lightly on your chin with your thumb. Hold for three to six seconds. Close your mouth slowly. Resisted mouth closing: Place your thumbs under your chin and your two index fingers on the ridge between your mouth and the bottom of your chin. Push down lightly on your chin as you close your mouth. Tongue up: Slowly open and close your mouth while keeping the tongue touching the roof of the mouth. Side-to-side jaw movement: Place an object about one fourth of an inch thick (for example, two tongue depressors) between your front teeth. Slowly move your jaw from side to side. Increase the thickness of the object as the exercise becomes easier Forward jaw movement: Place an object about one fourth of an inch thick between your front teeth and move the bottom jaw forward  so that the bottom teeth are in front of the top teeth. Increase the thickness of the object as the exercise becomes easier. These exercises should not be painful. If it hurts to do these exercises, stop doing them and talk to your family doctor.

## 2016-09-20 LAB — URINALYSIS, ROUTINE W REFLEX MICROSCOPIC
Bilirubin Urine: NEGATIVE
Glucose, UA: NEGATIVE
HGB URINE DIPSTICK: NEGATIVE
Ketones, ur: NEGATIVE
LEUKOCYTES UA: NEGATIVE
NITRITE: NEGATIVE
PH: 6 (ref 5.0–8.0)
Protein, ur: NEGATIVE
Specific Gravity, Urine: 1.021 (ref 1.001–1.035)

## 2016-09-20 LAB — VITAMIN B12: Vitamin B-12: 460 pg/mL (ref 200–1100)

## 2016-09-20 LAB — VITAMIN D 25 HYDROXY (VIT D DEFICIENCY, FRACTURES): Vit D, 25-Hydroxy: 30 ng/mL (ref 30–100)

## 2016-09-20 LAB — TSH: TSH: 1.63 m[IU]/L

## 2016-09-20 LAB — MICROALBUMIN / CREATININE URINE RATIO
Creatinine, Urine: 196 mg/dL (ref 20–320)
MICROALB UR: 0.4 mg/dL
MICROALB/CREAT RATIO: 2 ug/mg{creat} (ref <30)

## 2016-09-20 LAB — MAGNESIUM: MAGNESIUM: 2 mg/dL (ref 1.5–2.5)

## 2016-10-18 DIAGNOSIS — Z6821 Body mass index (BMI) 21.0-21.9, adult: Secondary | ICD-10-CM | POA: Diagnosis not present

## 2016-10-18 DIAGNOSIS — Z01419 Encounter for gynecological examination (general) (routine) without abnormal findings: Secondary | ICD-10-CM | POA: Diagnosis not present

## 2017-02-11 DIAGNOSIS — Z23 Encounter for immunization: Secondary | ICD-10-CM | POA: Diagnosis not present

## 2017-09-23 ENCOUNTER — Encounter: Payer: Self-pay | Admitting: Physician Assistant

## 2017-09-26 ENCOUNTER — Telehealth: Payer: Self-pay

## 2017-09-26 ENCOUNTER — Other Ambulatory Visit: Payer: Self-pay | Admitting: Adult Health

## 2017-09-26 MED ORDER — PREDNISONE 20 MG PO TABS: ORAL_TABLET | ORAL | 0 refills | Status: DC

## 2017-09-26 MED ORDER — PROMETHAZINE-DM 6.25-15 MG/5ML PO SYRP: 5.0000 mL | ORAL_SOLUTION | Freq: Four times a day (QID) | ORAL | 1 refills | Status: DC | PRN

## 2017-09-26 MED ORDER — AZITHROMYCIN 250 MG PO TABS: ORAL_TABLET | ORAL | 1 refills | Status: DC

## 2017-09-26 NOTE — Telephone Encounter (Signed)
Has not been feeling well since Sunday. Has sinus drainage and pressure, shortness of breath, wheezing and a cough with mucus green in color. Has taken mucinex, and has used son's inhaler yesterday due to tightness in the chest. Has an upcoming appointment for CPE.

## 2017-09-26 NOTE — Telephone Encounter (Signed)
Patient notified

## 2017-09-30 DIAGNOSIS — E559 Vitamin D deficiency, unspecified: Secondary | ICD-10-CM | POA: Insufficient documentation

## 2017-09-30 NOTE — Progress Notes (Signed)
Complete Physical  Assessment and Plan:  Routine general medical examination at a health care facility General safety and health guidelines reviewed  History of PCOS Weights managed Followed by GYN  History of IBS Controlled by diet and   Mixed hyperlipidemia Currently managed by lifestyle Continue low cholesterol diet and exercise.  -     TSH -     Lipid panel  Vitamin D deficiency -     VITAMIN D 25 Hydroxy (Vit-D Deficiency, Fractures)  BMI 21 Continue to recommend diet heavy in fruits and veggies and low in animal meats, cheeses, and dairy products, appropriate calorie intake Discuss exercise recommendations routinely Continue to monitor weight at each visit   Orders Placed This Encounter  Procedures  . CBC with Differential/Platelet  . COMPLETE METABOLIC PANEL WITH GFR  . Lipid panel  . TSH  . Hemoglobin A1c  . VITAMIN D 25 Hydroxy (Vit-D Deficiency, Fractures)  . Urinalysis w microscopic + reflex cultur  . Microalbumin / creatinine urine ratio  . Vitamin B12     Discussed med's effects and SE's. Screening labs and tests as requested with regular follow-up as recommended. Over 40 minutes of exam, counseling, chart review, and complex, high level critical decision making was performed this visit.   Future Appointments  Date Time Provider Department Center  10/06/2018 10:00 AM Judd Gaudierorbett, Miosha Behe, NP [facility] None     HPI  42 y.o. female  presents for a complete physical and follow up for has History of PCOS; History of IBS; Mixed hyperlipidemia; and Vitamin D deficiency on their problem list.   Has history of PCOS, goes to see GYN annually, due to schedule follow up.  Better at this time, have menses.. Husband with vasectomy.   She will wake up with headaches, has seen her dentist, thinks she has TMJ, will discuss mouth guard.   Her blood pressure has been controlled at home, today their BP is BP: 124/72 She does workout. She denies chest pain, shortness  of breath, dizziness.   BMI is Body mass index is 21.9 kg/m., she has not been working on diet and exercise. Drinks only water, very rarely tea or juice. Sleeps 7-8 hours every evening.  Wt Readings from Last 3 Encounters:  10/02/17 132 lb 9.6 oz (60.1 kg)  09/19/16 128 lb 4.8 oz (58.2 kg)  08/05/15 131 lb (59.4 kg)   She is not on cholesterol medication and denies myalgias. Her cholesterol is not at goal. The cholesterol last visit was:   Lab Results  Component Value Date   CHOL 180 09/19/2016   HDL 52 09/19/2016   LDLCALC 118 (H) 09/19/2016   TRIG 49 09/19/2016   CHOLHDL 3.5 09/19/2016    Last A1C in the office was:  Lab Results  Component Value Date   HGBA1C 5.3 08/05/2015   Last GFR: Lab Results  Component Value Date   GFRNONAA 82 09/19/2016   Patient is on cholecalciferol 2000 IU but not taking consistently Lab Results  Component Value Date   VD25OH 30 09/19/2016      Current Medications:  Current Outpatient Medications on File Prior to Visit  Medication Sig Dispense Refill  . guaiFENesin (MUCINEX PO) Take by mouth.    . hyoscyamine (LEVSIN SL) 0.125 MG SL tablet Place 1 tablet (0.125 mg total) under the tongue every 4 (four) hours as needed. 60 tablet 2  . IBUPROFEN PO Take by mouth as needed.    . Loratadine (CLARITIN PO) Take by mouth as needed.    .Marland Kitchen  Cholecalciferol (VITAMIN D) 2000 units tablet Take 2,000 Units by mouth daily.     No current facility-administered medications on file prior to visit.    Allergies:  Allergies  Allergen Reactions  . Allegra [Fexofenadine Hcl]    Medical History:  She has History of PCOS; History of IBS; Mixed hyperlipidemia; and Vitamin D deficiency on their problem list.   Health Maintenance:   Immunization History  Administered Date(s) Administered  . PPD Test 08/05/2015  . Td 10/06/2001  . Tdap 01/15/2012   Tetanus: 2013 Pneumovax: Prevnar 13:  Flu vaccine: 2018 Zostavax:  No LMP recorded. Pap: never abnormal  follows GYN, had PAP 10/18/2016 MGM: 075//2018, dense tissue, has annually by GYN, reports requested  DEXA:  Colonoscopy: n/a EGD: n/a  Dental: Dr. , 2019, q6 months Vision: remote, no issues   Patient Care Team: Lucky Cowboy, MD as PCP - General (Internal Medicine) Marcelle Overlie, MD as Consulting Physician (Obstetrics and Gynecology) Elmon Else, MD as Consulting Physician (Dermatology) Manning Charity, OD as Referring Physician (Optometry)  Surgical History:  She has a past surgical history that includes Wisdom tooth extraction. Family History:  Herfamily history includes Arthritis in her brother; Asthma in her son; Cancer in her father, maternal grandfather, and paternal grandmother; Diabetes in her mother; Hyperlipidemia in her mother; Leukemia in her father; Stroke in her maternal grandfather and maternal grandmother. Social History:  She reports that she has never smoked. She has never used smokeless tobacco. She reports that she does not drink alcohol or use drugs.  Review of Systems: Review of Systems  Constitutional: Negative for malaise/fatigue and weight loss.  HENT: Negative for hearing loss and tinnitus.   Eyes: Negative for blurred vision and double vision.  Respiratory: Negative for cough, sputum production, shortness of breath and wheezing.   Cardiovascular: Negative for chest pain, palpitations, orthopnea, claudication, leg swelling and PND.  Gastrointestinal: Negative for abdominal pain, blood in stool, constipation, diarrhea, heartburn, melena, nausea and vomiting.  Genitourinary: Negative.   Musculoskeletal: Negative.  Negative for falls, joint pain and myalgias.  Skin: Negative for rash.  Neurological: Negative for dizziness, tingling, sensory change, weakness and headaches.  Endo/Heme/Allergies: Negative for polydipsia.  Psychiatric/Behavioral: Negative for depression, memory loss, substance abuse and suicidal ideas. The patient is not nervous/anxious and  does not have insomnia.   All other systems reviewed and are negative.   Physical Exam: Estimated body mass index is 21.9 kg/m as calculated from the following:   Height as of this encounter: 5' 5.25" (1.657 m).   Weight as of this encounter: 132 lb 9.6 oz (60.1 kg). BP 124/72   Pulse 93   Temp 97.7 F (36.5 C)   Ht 5' 5.25" (1.657 m)   Wt 132 lb 9.6 oz (60.1 kg)   SpO2 98%   BMI 21.90 kg/m  General Appearance: Well nourished, in no apparent distress.  Eyes: PERRLA, EOMs, conjunctiva no swelling or erythema, normal fundi and vessels.  Sinuses: No Frontal/maxillary tenderness  ENT/Mouth: Ext aud canals clear, normal light reflex with TMs without erythema, bulging. Good dentition. No erythema, swelling, or exudate on post pharynx. Tonsils not swollen or erythematous. Hearing normal. + TMJ, no abscess, good dentition Neck: Supple, thyroid normal. No bruits  Respiratory: Respiratory effort normal, BS equal bilaterally without rales, rhonchi, wheezing or stridor.  Cardio: RRR without murmurs, rubs or gallops. Brisk peripheral pulses without edema.  Chest: symmetric, with normal excursions and percussion.  Breasts: Defer to GYN Abdomen: Soft, nontender at this time,  no guarding, rebound, hernias, masses, or organomegaly.  Lymphatics: Non tender without lymphadenopathy.  Genitourinary: Defer to GYN Musculoskeletal: Full ROM all peripheral extremities,5/5 strength, and normal gait.  Skin: Warm, dry without rashes, lesions, ecchymosis. Neuro: Cranial nerves intact, reflexes equal bilaterally. Normal muscle tone, no cerebellar symptoms. Sensation intact.  Psych: Awake and oriented X 3, normal affect, Insight and Judgment appropriate.   EKG: Defer, baseline in computer, low risk AORTA SCAN: defer  Dan Maker 10:27 AM [facility]

## 2017-10-02 ENCOUNTER — Encounter: Payer: Self-pay | Admitting: Adult Health

## 2017-10-02 ENCOUNTER — Ambulatory Visit (INDEPENDENT_AMBULATORY_CARE_PROVIDER_SITE_OTHER): Payer: 59 | Admitting: Adult Health

## 2017-10-02 VITALS — BP 124/72 | HR 93 | Temp 97.7°F | Ht 65.25 in | Wt 132.6 lb

## 2017-10-02 DIAGNOSIS — E782 Mixed hyperlipidemia: Secondary | ICD-10-CM | POA: Diagnosis not present

## 2017-10-02 DIAGNOSIS — Z6821 Body mass index (BMI) 21.0-21.9, adult: Secondary | ICD-10-CM

## 2017-10-02 DIAGNOSIS — E559 Vitamin D deficiency, unspecified: Secondary | ICD-10-CM

## 2017-10-02 DIAGNOSIS — Z0000 Encounter for general adult medical examination without abnormal findings: Secondary | ICD-10-CM

## 2017-10-02 DIAGNOSIS — Z79899 Other long term (current) drug therapy: Secondary | ICD-10-CM

## 2017-10-02 DIAGNOSIS — Z8742 Personal history of other diseases of the female genital tract: Secondary | ICD-10-CM

## 2017-10-02 DIAGNOSIS — Z130 Encounter for screening for diseases of the blood and blood-forming organs and certain disorders involving the immune mechanism: Secondary | ICD-10-CM

## 2017-10-02 DIAGNOSIS — Z8719 Personal history of other diseases of the digestive system: Secondary | ICD-10-CM

## 2017-10-02 DIAGNOSIS — Z1389 Encounter for screening for other disorder: Secondary | ICD-10-CM

## 2017-10-02 MED ORDER — HYOSCYAMINE SULFATE 0.125 MG SL SUBL: 0.1250 mg | SUBLINGUAL_TABLET | SUBLINGUAL | 2 refills | Status: DC | PRN

## 2017-10-02 NOTE — Patient Instructions (Addendum)
Try the "align" probiotic     Aim for 7+ servings of fruits and vegetables daily  80+ fluid ounces of water or unsweet tea for healthy kidneys  Limit alcohol intake, avoid smoking  Limit animal fats in diet for cholesterol and heart health - choose grass fed whenever available  Aim for low stress - take time to unwind and care for your mental health  Aim for 150 min of moderate intensity exercise weekly for heart health, and weights twice weekly for bone health  Aim for 7-9 hours of sleep daily      When it comes to diets, agreement about the perfect plan isn't easy to find, even among the experts. Experts at the [facility] [employer] developed an idea known as the Healthy Eating Plate. Just imagine a plate divided into logical, healthy portions.  The emphasis is on diet quality:  Load up on vegetables and fruits - one-half of your plate: Aim for color and variety, and remember that potatoes don't count.  Go for whole grains - one-quarter of your plate: Whole wheat, barley, wheat berries, quinoa, oats, brown rice, and foods made with them. If you want pasta, go with whole wheat pasta.  Protein power - one-quarter of your plate: Fish, chicken, beans, and nuts are all healthy, versatile protein sources. Limit red meat.  The diet, however, does go beyond the plate, offering a few other suggestions.  Use healthy plant oils, such as olive, canola, soy, corn, sunflower and peanut. Check the labels, and avoid partially hydrogenated oil, which have unhealthy trans fats.  If you're thirsty, drink water. Coffee and tea are good in moderation, but skip sugary drinks and limit milk and dairy products to one or two daily servings.  The type of carbohydrate in the diet is more important than the amount. Some sources of carbohydrates, such as vegetables, fruits, whole grains, and beans-are healthier than others.  Finally, stay active.      What You Need to Know About  Electronic Cigarettes Electronic cigarettes, or e-cigarettes, are battery-operated devices that deliver nicotine to your body. They come in many shapes, including in the shape of a cigarette, pipe, pen, and even a USB memory stick. E-cigarettes have a cartridge that contains a liquid form of nicotine. When you use the device, the liquid heats up. It then becomes a vapor. Inhaling this vapor is called vaping. While e-cigarettes do not contain tar and the same cancer-causing chemicals that are in tobacco cigarettes, they may contain other harmful and cancer-causing chemicals, such as formaldehyde or acetaldehyde. Nicotine is thought to increase your risk for certain types of cancer. Many e-cigarettes have chemical colorings and flavorings. It is not clear how much nicotine you get when vaping. The health effects of vaping are not completely known. Some people may use e-cigarettes in order to quit smoking tobacco. However, this has not been proven to work, and the Education officer, environmental (FDA) has not approved e-cigarettes for this purpose. How can using electronic cigarettes affect me?  E-cigarettes contain nicotine, which is a very addictive drug. Vaping may make you crave nicotine. Nicotine: ? Changes your blood sugar levels. ? Increases your heart rate, blood pressure, and breathing rate. ? Increases your risk of developing blood clots (hypercoaguable state) and diabetes.  If you smoke e-cigarettes, you may be more likely to start smoking or to smoke more tobacco cigarettes.  Becoming addicted to nicotine may make your brain more sensitive to other addictive drugs. You may  move to other addictive substances.  If you are pregnant, the nicotine in e-cigarettes may be harmful to your baby. Nicotine can cause: ? Brain or lung problems for your baby. ? Your baby to be born too early. ? Your baby to be born with a low birth weight.  If you are a child or a teen, vaping may affect your memory or  lower your attention span.  You may be in danger of overdosing on nicotine. Nicotine poisoning can cause nausea, vomiting, seizures, and trouble breathing. What are the benefits of stopping vaping? If you stop vaping, you can avoid:  Getting addicted to nicotine.  Having nicotine side effects.  Getting nicotine poisoning.  Being exposed to dangerous chemicals.  Increasing your risk of health problems.  Increasing your baby's risk of health problems, if you are pregnant.  Being more likely to use other addictive substances.  What steps can I take to stop vaping? If you can stop vaping on your own, do it before you become addicted to nicotine. If you need help stopping, ask your health care provider. There are three effective ways to fight nicotine addiction:  Nicotine replacement therapy. Using nicotine gum or a nicotine patch blocks your craving for nicotine. Over time, you can reduce the amount of nicotine you use until you can stop using nicotine completely without having cravings.  Prescription medicines approved to fight nicotine addiction. These stop nicotine cravings or block the effects of nicotine.  Behavioral therapy. This may include: ? A self-help smoking cessation program. ? Individual or group therapy. ? A smoking cessation support group.  Where can I get support? You can get support at these sites:  U.S. [employer] Library of Medicine: WealthAccelerators.nlhttps://medlineplus.gov/ency/article/007440.htm  U.S. Department of Health and Human Services: https://smokefree.gov  American Lung Association: ModelSolar.eshttp://www.lung.org/stop-smoking/i-want-to-quit/how-to-quit-smoking.html  Where can I get more information? Learn more about e-cigarettes from:  U.S. [employer] Institutes of Health: http://www.pena.net/https://www.drugabuse.gov/publications/drugfacts/electronic-cigarettes-e-cigarettes  U.S. Department of Health and Human Services:  PrankTips.huhttp://betobaccofree.hhs.gov/about-tobacco/Electronic-Cigarettes  Summary  E-cigarettes can cause nicotine addiction.  E-cigarettes are not approved as a way to stop smoking.  E-cigarettes are not a risk-free alternative to smoking tobacco.  There are ways to fight nicotine addiction.  Talk to your health care provider if you are unable to stop vaping on your own. This information is not intended to replace advice given to you by your health care provider. Make sure you discuss any questions you have with your health care provider. Document Released: 07/25/2015 Document Revised: 12/21/2015 Document Reviewed: 03/25/2015 Elsevier Interactive Patient Education  [employer]2018 Elsevier Inc.

## 2017-10-03 LAB — TSH: TSH: 2.38 m[IU]/L

## 2017-10-03 LAB — COMPLETE METABOLIC PANEL WITH GFR
AG RATIO: 1.9 (calc) (ref 1.0–2.5)
ALBUMIN MSPROF: 4.7 g/dL (ref 3.6–5.1)
ALT: 14 U/L (ref 6–29)
AST: 14 U/L (ref 10–30)
Alkaline phosphatase (APISO): 65 U/L (ref 33–115)
BUN: 10 mg/dL (ref 7–25)
CO2: 26 mmol/L (ref 20–32)
Calcium: 9.5 mg/dL (ref 8.6–10.2)
Chloride: 105 mmol/L (ref 98–110)
Creat: 0.9 mg/dL (ref 0.50–1.10)
GFR, EST AFRICAN AMERICAN: 92 mL/min/{1.73_m2} (ref >=60)
GFR, EST NON AFRICAN AMERICAN: 79 mL/min/{1.73_m2} (ref >=60)
GLOBULIN: 2.5 g/dL (ref 1.9–3.7)
Glucose, Bld: 85 mg/dL (ref 65–99)
POTASSIUM: 3.9 mmol/L (ref 3.5–5.3)
SODIUM: 140 mmol/L (ref 135–146)
TOTAL PROTEIN: 7.2 g/dL (ref 6.1–8.1)
Total Bilirubin: 0.4 mg/dL (ref 0.2–1.2)

## 2017-10-03 LAB — URINALYSIS W MICROSCOPIC + REFLEX CULTURE
Bacteria, UA: NONE SEEN /HPF
Bilirubin Urine: NEGATIVE
GLUCOSE, UA: NEGATIVE
Hgb urine dipstick: NEGATIVE
Hyaline Cast: NONE SEEN /LPF
Ketones, ur: NEGATIVE
Leukocyte Esterase: NEGATIVE
Nitrites, Initial: NEGATIVE
PROTEIN: NEGATIVE
RBC / HPF: NONE SEEN /HPF (ref 0–2)
SPECIFIC GRAVITY, URINE: 1.022 (ref 1.001–1.03)
WBC, UA: NONE SEEN /HPF (ref 0–5)
pH: 6.5 (ref 5.0–8.0)

## 2017-10-03 LAB — MICROALBUMIN / CREATININE URINE RATIO
CREATININE, URINE: 260 mg/dL (ref 20–275)
MICROALB/CREAT RATIO: 5 ug/mg{creat} (ref <30)
Microalb, Ur: 1.2 mg/dL

## 2017-10-03 LAB — CBC WITH DIFFERENTIAL/PLATELET
Basophils Absolute: 33 cells/uL (ref 0–200)
Basophils Relative: 0.6 %
Eosinophils Absolute: 61 cells/uL (ref 15–500)
Eosinophils Relative: 1.1 %
HEMATOCRIT: 38.9 % (ref 35.0–45.0)
Hemoglobin: 13.1 g/dL (ref 11.7–15.5)
LYMPHS ABS: 1535 {cells}/uL (ref 850–3900)
MCH: 28.9 pg (ref 27.0–33.0)
MCHC: 33.7 g/dL (ref 32.0–36.0)
MCV: 85.9 fL (ref 80.0–100.0)
MPV: 12.3 fL (ref 7.5–12.5)
Monocytes Relative: 6.6 %
NEUTROS PCT: 63.8 %
Neutro Abs: 3509 cells/uL (ref 1500–7800)
Platelets: 208 10*3/uL (ref 140–400)
RBC: 4.53 10*6/uL (ref 3.80–5.10)
RDW: 12.3 % (ref 11.0–15.0)
Total Lymphocyte: 27.9 %
WBC: 5.5 10*3/uL (ref 3.8–10.8)
WBCMIX: 363 {cells}/uL (ref 200–950)

## 2017-10-03 LAB — HEMOGLOBIN A1C
EAG (MMOL/L): 5.7 (calc)
Hgb A1c MFr Bld: 5.2 % of total Hgb (ref <5.7)
MEAN PLASMA GLUCOSE: 103 (calc)

## 2017-10-03 LAB — NO CULTURE INDICATED

## 2017-10-03 LAB — LIPID PANEL
Cholesterol: 187 mg/dL (ref <200)
HDL: 43 mg/dL — ABNORMAL LOW (ref >50)
LDL Cholesterol (Calc): 118 mg/dL (calc) — ABNORMAL HIGH
NON-HDL CHOLESTEROL (CALC): 144 mg/dL — AB (ref <130)
Total CHOL/HDL Ratio: 4.3 (calc) (ref <5.0)
Triglycerides: 147 mg/dL (ref <150)

## 2017-10-03 LAB — VITAMIN B12: VITAMIN B 12: 537 pg/mL (ref 200–1100)

## 2017-10-03 LAB — VITAMIN D 25 HYDROXY (VIT D DEFICIENCY, FRACTURES): Vit D, 25-Hydroxy: 25 ng/mL — ABNORMAL LOW (ref 30–100)

## 2018-02-08 DIAGNOSIS — Z23 Encounter for immunization: Secondary | ICD-10-CM | POA: Diagnosis not present

## 2018-06-26 DIAGNOSIS — H524 Presbyopia: Secondary | ICD-10-CM | POA: Diagnosis not present

## 2018-06-26 DIAGNOSIS — H52203 Unspecified astigmatism, bilateral: Secondary | ICD-10-CM | POA: Diagnosis not present

## 2018-08-26 DIAGNOSIS — Z01419 Encounter for gynecological examination (general) (routine) without abnormal findings: Secondary | ICD-10-CM | POA: Diagnosis not present

## 2018-08-26 DIAGNOSIS — Z6821 Body mass index (BMI) 21.0-21.9, adult: Secondary | ICD-10-CM | POA: Diagnosis not present

## 2018-10-02 NOTE — Progress Notes (Signed)
Complete Physical  Assessment and Plan:  Routine general medical examination at a health care facility General safety and health guidelines reviewed  History of PCOS Weights managed Followed by GYN  History of IBS Controlled by diet and hyoscyamine PRN  Mixed hyperlipidemia Currently managed by lifestyle Continue low cholesterol diet and exercise.  -     TSH -     Lipid panel  Vitamin D deficiency Continue supplementation for goal of 60-100 -     VITAMIN D 25 Hydroxy (Vit-D Deficiency, Fractures)  BMI 21 Continue to recommend diet heavy in fruits and veggies and low in animal meats, cheeses, and dairy products, appropriate calorie intake Discuss exercise recommendations routinely Continue to monitor weight at each visit  TMJ Follow up dentist/oral surgeon as recommended  Orders Placed This Encounter  Procedures  . CBC with Differential/Platelet  . COMPLETE METABOLIC PANEL WITH GFR  . Magnesium  . Lipid panel  . TSH  . Hemoglobin A1c  . VITAMIN D 25 Hydroxy (Vit-D Deficiency, Fractures)  . Urinalysis, Routine w reflex microscopic     Discussed med's effects and SE's. Screening labs and tests as requested with regular follow-up as recommended. Over 40 minutes of exam, counseling, chart review, and complex, high level critical decision making was performed this visit.   Future Appointments  Date Time Provider Department Center  10/07/2019 10:00 AM Judd Gaudierorbett, Benjy Kana, NP [facility] None     HPI  43 y.o. female  presents for a complete physical and follow up for has History of PCOS; History of IBS; Mixed hyperlipidemia; and Vitamin D deficiency on their problem list.   Married, works as Tax adviserschool nurse, 3 kids 6, 12, 15  Has history of PCOS, goes to see GYN annually, saw on 08/26/2018.  Better at this time, have menses. Husband with vasectomy. Aunt had endometrial cancer so just had genetic testing done, will follow up shortly.  Has dx of IBS, saw GI in 20s, controlled  by lifestyle modification and uses hyoscyamine PRN   She will wake up with headaches, has seen her dentist, thinks she has TMJ, was recommended oral surgery referral and working with insurance.   Her blood pressure has been controlled at home, today their BP is BP: 110/80 She does workout. She denies chest pain, shortness of breath, dizziness.   BMI is Body mass index is 21.63 kg/m., she has not been working on diet and exercise. Has been cooking at home, loves fruit. Drinks only water, very rarely tea or juice. Sleeps 7-8 hours every evening.  Wt Readings from Last 3 Encounters:  10/06/18 132 lb (59.9 kg)  10/02/17 132 lb 9.6 oz (60.1 kg)  09/19/16 128 lb 4.8 oz (58.2 kg)   She is not on cholesterol medication and denies myalgias. Her cholesterol is not at goal. The cholesterol last visit was:   Lab Results  Component Value Date   CHOL 187 10/02/2017   HDL 43 (L) 10/02/2017   LDLCALC 118 (H) 10/02/2017   TRIG 147 10/02/2017   CHOLHDL 4.3 10/02/2017    Last A1C in the office was:  Lab Results  Component Value Date   HGBA1C 5.2 10/02/2017   Last GFR: Lab Results  Component Value Date   GFRNONAA 79 10/02/2017   Patient is on cholecalciferol 2000 IU doing better taking regularly Lab Results  Component Value Date   VD25OH 25 (L) 10/02/2017       Current Medications:  Current Outpatient Medications on File Prior to Visit  Medication Sig  Dispense Refill  . Cholecalciferol (VITAMIN D) 2000 units tablet Take 4,000 Units by mouth daily.     . hyoscyamine (LEVSIN SL) 0.125 MG SL tablet Place 1 tablet (0.125 mg total) under the tongue every 4 (four) hours as needed. 60 tablet 2  . IBUPROFEN PO Take by mouth as needed.    . Loratadine (CLARITIN PO) Take by mouth as needed.    Marland Kitchen. guaiFENesin (MUCINEX PO) Take by mouth.     No current facility-administered medications on file prior to visit.    Allergies:  Allergies  Allergen Reactions  . Allegra [Fexofenadine Hcl]    Medical  History:  She has History of PCOS; History of IBS; Mixed hyperlipidemia; and Vitamin D deficiency on their problem list.   Health Maintenance:   Immunization History  Administered Date(s) Administered  . PPD Test 08/05/2015  . Td 10/06/2001  . Tdap 01/15/2012   Tetanus: 2013 Pneumovax: Prevnar 13:  Flu vaccine: 01/2018 via health department  Zostavax:  No LMP recorded. Pap: never abnormal follows GYN, had PAP 08/26/2018 MGM: 08/26/2018, dense tissue, has annually by GYN, reports requested  DEXA:  Colonoscopy: n/a EGD: n/a  Dental: Dr. Genia Delastor, 2020, q6 months Vision: Dr. Emily FilbertGould, 2019  Patient Care Team: Lucky CowboyMcKeown, William, MD as PCP - General (Internal Medicine) Marcelle OverlieGrewal, Michelle, MD as Consulting Physician (Obstetrics and Gynecology) Elmon ElseGould, Karen, MD as Consulting Physician (Dermatology) Manning CharityGould, Jason, OD as Referring Physician (Optometry)  Surgical History:  She has a past surgical history that includes Wisdom tooth extraction. Family History:  Herfamily history includes Arthritis in her brother; Asthma in her son; Cancer in her father, maternal grandfather, and paternal grandmother; Diabetes in her mother; Endometrial cancer (age of onset: 3165) in her maternal aunt; Hyperlipidemia in her mother; Leukemia in her father; Stroke in her maternal grandfather and maternal grandmother. Social History:  She reports that she has never smoked. She has never used smokeless tobacco. She reports that she does not drink alcohol or use drugs.  Review of Systems: Review of Systems  Constitutional: Negative for malaise/fatigue and weight loss.  HENT: Negative for hearing loss and tinnitus.   Eyes: Negative for blurred vision and double vision.  Respiratory: Negative for cough, sputum production, shortness of breath and wheezing.   Cardiovascular: Negative for chest pain, palpitations, orthopnea, claudication, leg swelling and PND.  Gastrointestinal: Negative for abdominal pain, blood in  stool, constipation, diarrhea, heartburn, melena, nausea and vomiting.  Genitourinary: Negative.   Musculoskeletal: Negative.  Negative for falls, joint pain and myalgias.  Skin: Negative for rash.  Neurological: Negative for dizziness, tingling, sensory change, weakness and headaches.  Endo/Heme/Allergies: Negative for polydipsia.  Psychiatric/Behavioral: Negative for depression, memory loss, substance abuse and suicidal ideas. The patient is not nervous/anxious and does not have insomnia.   All other systems reviewed and are negative.   Physical Exam: Estimated body mass index is 21.63 kg/m as calculated from the following:   Height as of this encounter: 5' 5.5" (1.664 m).   Weight as of this encounter: 132 lb (59.9 kg). BP 110/80   Pulse 90   Temp 97.7 F (36.5 C)   Ht 5' 5.5" (1.664 m)   Wt 132 lb (59.9 kg)   SpO2 99%   BMI 21.63 kg/m  General Appearance: Well nourished, in no apparent distress.  Eyes: PERRLA, EOMs, conjunctiva no swelling or erythema, normal fundi and vessels.  Sinuses: No Frontal/maxillary tenderness  ENT/Mouth: Ext aud canals clear, normal light reflex with TMs without erythema, bulging. Good  dentition. No erythema, swelling, or exudate on post pharynx. Tonsils not swollen or erythematous. Hearing normal. + TMJ, no abscess, good dentition Neck: Supple, thyroid normal. No bruits  Respiratory: Respiratory effort normal, BS equal bilaterally without rales, rhonchi, wheezing or stridor.  Cardio: RRR without murmurs, rubs or gallops. Brisk peripheral pulses without edema.  Chest: symmetric, with normal excursions and percussion.  Breasts: Defer to GYN Abdomen: Soft, nontender at this time, no guarding, rebound, hernias, masses, or organomegaly.  Lymphatics: Non tender without lymphadenopathy.  Genitourinary: Defer to GYN Musculoskeletal: Full ROM all peripheral extremities,5/5 strength, and normal gait.  Skin: Warm, dry without rashes, lesions, ecchymosis.  Numerous nevi of benign appearance Neuro: Cranial nerves intact, reflexes equal bilaterally. Normal muscle tone, no cerebellar symptoms. Sensation intact.  Psych: Awake and oriented X 3, normal affect, Insight and Judgment appropriate.   EKG: WNL, no significant changes from previous test  Izora Ribas 10:24 AM [facility]

## 2018-10-06 ENCOUNTER — Other Ambulatory Visit: Payer: Self-pay

## 2018-10-06 ENCOUNTER — Encounter: Payer: Self-pay | Admitting: Adult Health

## 2018-10-06 ENCOUNTER — Ambulatory Visit (INDEPENDENT_AMBULATORY_CARE_PROVIDER_SITE_OTHER): Payer: 59 | Admitting: Adult Health

## 2018-10-06 VITALS — BP 110/80 | HR 90 | Temp 97.7°F | Ht 65.5 in | Wt 132.0 lb

## 2018-10-06 DIAGNOSIS — Z136 Encounter for screening for cardiovascular disorders: Secondary | ICD-10-CM

## 2018-10-06 DIAGNOSIS — M26609 Unspecified temporomandibular joint disorder, unspecified side: Secondary | ICD-10-CM | POA: Insufficient documentation

## 2018-10-06 DIAGNOSIS — Z8742 Personal history of other diseases of the female genital tract: Secondary | ICD-10-CM

## 2018-10-06 DIAGNOSIS — Z79899 Other long term (current) drug therapy: Secondary | ICD-10-CM

## 2018-10-06 DIAGNOSIS — Z0000 Encounter for general adult medical examination without abnormal findings: Secondary | ICD-10-CM | POA: Diagnosis not present

## 2018-10-06 DIAGNOSIS — R7309 Other abnormal glucose: Secondary | ICD-10-CM

## 2018-10-06 DIAGNOSIS — I10 Essential (primary) hypertension: Secondary | ICD-10-CM | POA: Diagnosis not present

## 2018-10-06 DIAGNOSIS — Z8719 Personal history of other diseases of the digestive system: Secondary | ICD-10-CM

## 2018-10-06 DIAGNOSIS — E782 Mixed hyperlipidemia: Secondary | ICD-10-CM

## 2018-10-06 DIAGNOSIS — D509 Iron deficiency anemia, unspecified: Secondary | ICD-10-CM

## 2018-10-06 DIAGNOSIS — Z6821 Body mass index (BMI) 21.0-21.9, adult: Secondary | ICD-10-CM

## 2018-10-06 DIAGNOSIS — E559 Vitamin D deficiency, unspecified: Secondary | ICD-10-CM

## 2018-10-06 DIAGNOSIS — Z8679 Personal history of other diseases of the circulatory system: Secondary | ICD-10-CM | POA: Insufficient documentation

## 2018-10-06 NOTE — Patient Instructions (Addendum)
Alyssa Young , Thank you for taking time to come for your Annual Wellness Visit. I appreciate your ongoing commitment to your health goals. Please review the following plan we discussed and let me know if I can assist you in the future.   These are the goals we discussed: Goals   None     This is a list of the screening recommended for you and due dates:  Health Maintenance  Topic Date Due  . Flu Shot  11/15/2018  . Pap Smear  10/15/2019  . Tetanus Vaccine  01/14/2022  . HIV Screening  Completed    Know what a healthy weight is for you (roughly BMI <25) and aim to maintain this  Aim for 7+ servings of fruits and vegetables daily  65-80+ fluid ounces of water or unsweet tea for healthy kidneys  Limit to max 1 drink of alcohol per day; avoid smoking/tobacco  Limit animal fats in diet for cholesterol and heart health - choose grass fed whenever available  Avoid highly processed foods, and foods high in saturated/trans fats  Aim for low stress - take time to unwind and care for your mental health  Aim for 150 min of moderate intensity exercise weekly for heart health, and weights twice weekly for bone health  Aim for 7-9 hours of sleep daily     Preventing High Cholesterol Cholesterol is a waxy, fat-like substance that your body needs in small amounts. Your liver makes all the cholesterol that your body needs. Having high cholesterol (hypercholesterolemia) increases your risk for heart disease and stroke. Extra (excess) cholesterol comes from the food you eat, such as animal-based fat (saturated fat) from meat and some dairy products. High cholesterol can often be prevented with diet and lifestyle changes. If you already have high cholesterol, you can control it with diet and lifestyle changes, as well as medicine. What nutrition changes can be made?  Eat less saturated fat. Foods that contain saturated fat include red meat and some dairy products.  Avoid processed meats,  like bacon and lunch meats.  Avoid trans fats, which are found in margarine and some baked goods.  Avoid foods and beverages that have added sugars.  Eat more fruits, vegetables, and whole grains.  Choose healthy sources of protein, such as fish, poultry, and nuts.  Choose healthy sources of fat, such as: ? Nuts. ? Vegetable oils, especially olive oil. ? Fish that have healthy fats (omega-3 fatty acids), such as mackerel or salmon. What lifestyle changes can be made?   Lose weight if you are overweight. Losing 5-10 lb (2.3-4.5 kg) can help prevent or control high cholesterol and reduce your risk for diabetes and high blood pressure. Ask your health care provider to help you with a diet and exercise plan to safely lose weight.  Get enough exercise. Do at least 150 minutes of moderate-intensity exercise each week. ? You could do this in short exercise sessions several times a day, or you could do longer exercise sessions a few times a week. For example, you could take a brisk 10-minute walk or bike ride, 3 times a day, for 5 days a week.  Do not smoke. If you need help quitting, ask your health care provider.  Limit your alcohol intake. If you drink alcohol, limit alcohol intake to no more than 1 drink a day for nonpregnant women and 2 drinks a day for men. One drink equals 12 oz of beer, 5 oz of wine, or 1 oz of hard  liquor. Why are these changes important?  If you have high cholesterol, deposits (plaques) may build up on the walls of your blood vessels. Plaques make the arteries narrower and stiffer, which can restrict or block blood flow and cause blood clots to form. This greatly increases your risk for heart attack and stroke. Making diet and lifestyle changes can reduce your risk for these life-threatening conditions. What can I do to lower my risk?  Manage your risk factors for high cholesterol. Talk with your health care provider about all of your risk factors and how to lower  your risk.  Manage other conditions that you have, such as diabetes or high blood pressure (hypertension).  Have your cholesterol checked at regular intervals.  Keep all follow-up visits as told by your health care provider. This is important. How is this treated? In addition to diet and lifestyle changes, your health care provider may recommend medicines to help lower cholesterol, such as a medicine to reduce the amount of cholesterol made in your liver. You may need medicine if:  Diet and lifestyle changes do not lower your cholesterol enough.  You have high cholesterol and other risk factors for heart disease or stroke. Take over-the-counter and prescription medicines only as told by your health care provider. Where to find more information  American Heart Association: 1122334455www.heart.org/HEARTORG/Conditions/Cholesterol/Cholesterol_UCM_001089_SubHomePage.jsp  National Heart, Lung, and Blood Institute: http://hood.com/www.nhlbi.nih.gov/health/resources/heart/heart-cholesterol-hbc-what-html Summary  High cholesterol increases your risk for heart disease and stroke. By keeping your cholesterol level low, you can reduce your risk for these conditions.  Diet and lifestyle changes are the most important steps in preventing high cholesterol.  Work with your health care provider to manage your risk factors, and have your blood tested regularly. This information is not intended to replace advice given to you by your health care provider. Make sure you discuss any questions you have with your health care provider. Document Released: 04/17/2015 Document Revised: 12/10/2015 Document Reviewed: 12/10/2015 Elsevier Interactive Patient Education  2019 [employer]

## 2018-10-07 LAB — URINALYSIS, ROUTINE W REFLEX MICROSCOPIC
Bilirubin Urine: NEGATIVE
Glucose, UA: NEGATIVE
Hgb urine dipstick: NEGATIVE
Ketones, ur: NEGATIVE
Leukocytes,Ua: NEGATIVE
Nitrite: NEGATIVE
Protein, ur: NEGATIVE
Specific Gravity, Urine: 1.021 (ref 1.001–1.03)
pH: 5.5 (ref 5.0–8.0)

## 2018-10-07 LAB — CBC WITH DIFFERENTIAL/PLATELET
Absolute Monocytes: 238 cells/uL (ref 200–950)
Basophils Absolute: 29 cells/uL (ref 0–200)
Basophils Relative: 0.7 %
Eosinophils Absolute: 21 cells/uL (ref 15–500)
Eosinophils Relative: 0.5 %
HCT: 39.6 % (ref 35.0–45.0)
Hemoglobin: 13 g/dL (ref 11.7–15.5)
Lymphs Abs: 1009 cells/uL (ref 850–3900)
MCH: 29 pg (ref 27.0–33.0)
MCHC: 32.8 g/dL (ref 32.0–36.0)
MCV: 88.4 fL (ref 80.0–100.0)
MPV: 12.8 fL — ABNORMAL HIGH (ref 7.5–12.5)
Monocytes Relative: 5.8 %
Neutro Abs: 2804 cells/uL (ref 1500–7800)
Neutrophils Relative %: 68.4 %
Platelets: 158 10*3/uL (ref 140–400)
RBC: 4.48 10*6/uL (ref 3.80–5.10)
RDW: 13.8 % (ref 11.0–15.0)
Total Lymphocyte: 24.6 %
WBC: 4.1 10*3/uL (ref 3.8–10.8)

## 2018-10-07 LAB — VITAMIN D 25 HYDROXY (VIT D DEFICIENCY, FRACTURES): Vit D, 25-Hydroxy: 30 ng/mL (ref 30–100)

## 2018-10-07 LAB — COMPLETE METABOLIC PANEL WITH GFR
AG Ratio: 2.5 (calc) (ref 1.0–2.5)
ALT: 15 U/L (ref 6–29)
AST: 18 U/L (ref 10–30)
Albumin: 4.8 g/dL (ref 3.6–5.1)
Alkaline phosphatase (APISO): 51 U/L (ref 31–125)
BUN: 11 mg/dL (ref 7–25)
CO2: 26 mmol/L (ref 20–32)
Calcium: 9.4 mg/dL (ref 8.6–10.2)
Chloride: 107 mmol/L (ref 98–110)
Creat: 0.88 mg/dL (ref 0.50–1.10)
GFR, Est African American: 94 mL/min/{1.73_m2} (ref >=60)
GFR, Est Non African American: 81 mL/min/{1.73_m2} (ref >=60)
Globulin: 1.9 g/dL (calc) (ref 1.9–3.7)
Glucose, Bld: 84 mg/dL (ref 65–99)
Potassium: 4.1 mmol/L (ref 3.5–5.3)
Sodium: 139 mmol/L (ref 135–146)
Total Bilirubin: 0.4 mg/dL (ref 0.2–1.2)
Total Protein: 6.7 g/dL (ref 6.1–8.1)

## 2018-10-07 LAB — MAGNESIUM: Magnesium: 2 mg/dL (ref 1.5–2.5)

## 2018-10-07 LAB — LIPID PANEL
Cholesterol: 195 mg/dL (ref <200)
HDL: 48 mg/dL — ABNORMAL LOW (ref >=50)
LDL Cholesterol (Calc): 127 mg/dL (calc) — ABNORMAL HIGH
Non-HDL Cholesterol (Calc): 147 mg/dL (calc) — ABNORMAL HIGH (ref <130)
Total CHOL/HDL Ratio: 4.1 (calc) (ref <5.0)
Triglycerides: 94 mg/dL (ref <150)

## 2018-10-07 LAB — TSH: TSH: 2.16 mIU/L

## 2018-10-07 LAB — HEMOGLOBIN A1C
Hgb A1c MFr Bld: 5.2 % of total Hgb (ref <5.7)
Mean Plasma Glucose: 103 (calc)
eAG (mmol/L): 5.7 (calc)

## 2018-10-07 LAB — IRON, TOTAL/TOTAL IRON BINDING CAP
Iron: 74 ug/dL (ref 40–190)
TIBC: 384 mcg/dL (calc) (ref 250–450)

## 2018-10-07 LAB — IRON,?TOTAL/TOTAL IRON BINDING CAP: %SAT: 19 % (calc) (ref 16–45)

## 2019-06-03 ENCOUNTER — Other Ambulatory Visit: Payer: Self-pay | Admitting: Radiology

## 2019-06-03 DIAGNOSIS — N6320 Unspecified lump in the left breast, unspecified quadrant: Secondary | ICD-10-CM

## 2019-06-16 ENCOUNTER — Other Ambulatory Visit: Payer: Self-pay

## 2019-06-16 ENCOUNTER — Ambulatory Visit
Admission: RE | Admit: 2019-06-16 | Discharge: 2019-06-16 | Disposition: A | Discharge status: Home / Self Care | Payer: 59 | Source: Ambulatory Visit | Attending: Radiology | Admitting: Radiology

## 2019-06-16 ENCOUNTER — Ambulatory Visit
Admission: RE | Admit: 2019-06-16 | Discharge: 2019-06-16 | Disposition: A | Discharge status: Home / Self Care | Payer: BLUE CROSS/BLUE SHIELD | Source: Ambulatory Visit | Attending: Radiology | Admitting: Radiology

## 2019-06-16 DIAGNOSIS — N6320 Unspecified lump in the left breast, unspecified quadrant: Secondary | ICD-10-CM

## 2019-10-06 NOTE — Progress Notes (Signed)
Complete Physical  Assessment and Plan:  Routine general medical examination at a health care facility Health screening and lifestyle reviewed   History of PCOS Weights managed Followed by GYN  History of IBS Controlled by diet and hyoscyamine PRN  Mixed hyperlipidemia Currently managed by lifestyle Continue low cholesterol diet and exercise.  -     TSH -     Lipid panel  Vitamin D deficiency Continue supplementation for goal of 60-100 -     VITAMIN D 25 Hydroxy (Vit-D Deficiency, Fractures)  BMI 21 Continue to recommend diet heavy in fruits and veggies and low in animal meats, cheeses, and dairy products, appropriate calorie intake Discuss exercise recommendations routinely Continue to monitor weight at each visit  TMJ Follow up dentist/oral surgeon as recommended, recently improved  Orders Placed This Encounter  Procedures  . CBC with Differential/Platelet  . COMPLETE METABOLIC PANEL WITH GFR  . Magnesium  . Lipid panel  . TSH  . VITAMIN D 25 Hydroxy (Vit-D Deficiency, Fractures)  . Urinalysis, Routine w reflex microscopic   Discussed med's effects and SE's. Screening labs and tests as requested with regular follow-up as recommended. Over 40 minutes of exam, counseling, chart review, and complex, high level critical decision making was performed this visit.   Future Appointments  Date Time Provider Department Center  10/06/2020  2:00 PM Judd Gaudier, NP [facility] None     HPI  44 y.o. female  presents for a complete physical and follow up for has History of PCOS; History of IBS; Mixed hyperlipidemia; Vitamin D deficiency; TMJ disease; and History of mitral valve prolapse on their problem list.   Married, works as Tax adviser, 3 kids 7, 13, 16  Has history of PCOS, goes to see GYN annually, saw on 08/26/2018, needs to schedule.    Better at this time, have menses. Husband with vasectomy.   Has dx of IBS D/C, saw GI in 20s, controlled by lifestyle  modification and uses hyoscyamine PRN, flares with stress or when out of routine   She will wake up with headaches, has seen her dentist, thinks she has TMJ, was recommended oral surgery referral but couldn't get locally, patient opted to decline for now. She states sx have improved, mild.   Her blood pressure has been controlled at home, today their BP is BP: 100/74 She does workout. She denies chest pain, shortness of breath, dizziness.   BMI is Body mass index is 21.73 kg/m., she has not been working on diet and exercise.  Has been cooking at home, loves fruit.  Drinks only water, very rarely tea or juice. Minimal 3+ bottles daily  Sleeps 7-8 hours every evening.  Wt Readings from Last 3 Encounters:  10/07/19 131 lb 9.6 oz (59.7 kg)  10/06/18 132 lb (59.9 kg)  10/02/17 132 lb 9.6 oz (60.1 kg)   She is not on cholesterol medication and denies myalgias. Her cholesterol is not at goal. The cholesterol last visit was:   Lab Results  Component Value Date   CHOL 195 10/06/2018   HDL 48 (L) 10/06/2018   LDLCALC 127 (H) 10/06/2018   TRIG 94 10/06/2018   CHOLHDL 4.1 10/06/2018    Last A1C in the office was:  Lab Results  Component Value Date   HGBA1C 5.2 10/06/2018   Last GFR: Lab Results  Component Value Date   GFRNONAA 81 10/06/2018   Patient is on cholecalciferol 4000 IU doing taking 3-4 days a week  Lab Results  Component Value  Date   VD25OH 30 10/06/2018        Current Medications:  Current Outpatient Medications on File Prior to Visit  Medication Sig Dispense Refill  . Cholecalciferol (VITAMIN D) 2000 units tablet Take 4,000 Units by mouth daily.     Marland Kitchen guaiFENesin (MUCINEX PO) Take by mouth as needed.     . IBUPROFEN PO Take by mouth as needed.    . Loratadine (CLARITIN PO) Take by mouth as needed.     No current facility-administered medications on file prior to visit.   Allergies:  Allergies  Allergen Reactions  . Allegra [Fexofenadine Hcl]    Medical  History:  She has History of PCOS; History of IBS; Mixed hyperlipidemia; Vitamin D deficiency; TMJ disease; and History of mitral valve prolapse on their problem list.   Health Maintenance:   Immunization History  Administered Date(s) Administered  . Influenza-Unspecified 02/13/2019  . PPD Test 08/05/2015  . Td 10/06/2001  . Tdap 01/15/2012   Tetanus: 2013 Pneumovax: Prevnar 13:  Flu vaccine: 01/2019  Zostavax: n/a Covid 19: 2/2, 2021, pfizer - will send card  Patient's last menstrual period was 10/02/2019. Pap: never abnormal, follows GYN, had PAP 08/26/2018 MGM: 08/26/2018, dense tissue, has annually by GYN - had diagnostic L in 06/2019, showed cyst, resume annually  DEXA:  Colonoscopy: n/a EGD: n/a  Dental: Dr. Loma Newton, 2020, q6 months, will go November 30 2019 Vision: Dr. Delman Cheadle, 2019  Patient Care Team: Unk Pinto, MD as PCP - General (Internal Medicine) Dian Queen, MD as Consulting Physician (Obstetrics and Gynecology) Jari Pigg, MD as Consulting Physician (Dermatology) Melissa Noon, [place]ak[place] Dezza as Referring Physician (Optometry)  Surgical History:  She has a past surgical history that includes Wisdom tooth extraction. Family History:  Herfamily history includes Arthritis in her brother; Asthma in her son; Cancer in her father, maternal grandfather, and paternal grandmother; Diabetes in her mother; Endometrial cancer (age of onset: 34) in her maternal aunt; Hyperlipidemia in her mother; Leukemia in her father; Stroke in her maternal grandfather and maternal grandmother. Social History:  She reports that she has never smoked. She has never used smokeless tobacco. She reports that she does not drink alcohol and does not use drugs.  Review of Systems: Review of Systems  Constitutional: Negative for malaise/fatigue and weight loss.  HENT: Negative for hearing loss and tinnitus.   Eyes: Negative for blurred vision and double vision.  Respiratory: Negative for cough,  sputum production, shortness of breath and wheezing.   Cardiovascular: Negative for chest pain, palpitations, orthopnea, claudication, leg swelling and PND.  Gastrointestinal: Positive for constipation and diarrhea. Negative for abdominal pain, blood in stool, heartburn, melena, nausea and vomiting.       Intermittent occasional diarrhea/constipation associated with stress (IBS)  Genitourinary: Negative.   Musculoskeletal: Negative.  Negative for falls, joint pain and myalgias.  Skin: Negative for rash.  Neurological: Negative for dizziness, tingling, sensory change, weakness and headaches.  Endo/Heme/Allergies: Negative for polydipsia.  Psychiatric/Behavioral: Negative for depression, memory loss, substance abuse and suicidal ideas. The patient is not nervous/anxious and does not have insomnia.   All other systems reviewed and are negative.   Physical Exam: Estimated body mass index is 21.73 kg/m as calculated from the following:   Height as of this encounter: 5' 5.25" (1.657 m).   Weight as of this encounter: 131 lb 9.6 oz (59.7 kg). BP 100/74   Pulse 83   Temp (!) 97.3 F (36.3 C)   Ht 5' 5.25" (1.657 m)  Wt 131 lb 9.6 oz (59.7 kg)   LMP 10/02/2019   SpO2 96%   BMI 21.73 kg/m  General Appearance: Well nourished, in no apparent distress.  Eyes: PERRLA, EOMs, conjunctiva no swelling or erythema Sinuses: No Frontal/maxillary tenderness  ENT/Mouth: Ext aud canals clear, normal light reflex with TMs without erythema, bulging. Good dentition. No erythema, swelling, or exudate on post pharynx. Tonsils not swollen or erythematous. Hearing normal. + TMJ, no abscess, good dentition Neck: Supple, thyroid normal. No bruits  Respiratory: Respiratory effort normal, BS equal bilaterally without rales, rhonchi, wheezing or stridor.  Cardio: RRR without murmurs, rubs or gallops. Brisk peripheral pulses without edema.  Chest: symmetric, with normal excursions and percussion.  Breasts: Defer to  GYN Abdomen: Soft, nontender at this time, no guarding, rebound, hernias, masses, or organomegaly.  Lymphatics: Non tender without lymphadenopathy.  Genitourinary: Defer to GYN Musculoskeletal: Full ROM all peripheral extremities,5/5 strength, and normal gait.  Skin: Warm, dry without rashes, lesions, ecchymosis. Numerous nevi of benign appearance.  Neuro: Cranial nerves intact, reflexes equal bilaterally. Normal muscle tone, no cerebellar symptoms. Sensation intact.  Psych: Awake and oriented X 3, normal affect, Insight and Judgment appropriate.   EKG: WNL in 2020, no concerns, low risk, defer  Dan Maker 10:39 AM [facility]

## 2019-10-07 ENCOUNTER — Encounter: Payer: Self-pay | Admitting: Adult Health

## 2019-10-07 ENCOUNTER — Other Ambulatory Visit: Payer: Self-pay

## 2019-10-07 ENCOUNTER — Ambulatory Visit (INDEPENDENT_AMBULATORY_CARE_PROVIDER_SITE_OTHER): Payer: 59 | Admitting: Adult Health

## 2019-10-07 VITALS — BP 100/74 | HR 83 | Temp 97.3°F | Ht 65.25 in | Wt 131.6 lb

## 2019-10-07 DIAGNOSIS — Z0001 Encounter for general adult medical examination with abnormal findings: Secondary | ICD-10-CM

## 2019-10-07 DIAGNOSIS — E559 Vitamin D deficiency, unspecified: Secondary | ICD-10-CM

## 2019-10-07 DIAGNOSIS — Z1389 Encounter for screening for other disorder: Secondary | ICD-10-CM

## 2019-10-07 DIAGNOSIS — Z0000 Encounter for general adult medical examination without abnormal findings: Secondary | ICD-10-CM | POA: Diagnosis not present

## 2019-10-07 DIAGNOSIS — Z8679 Personal history of other diseases of the circulatory system: Secondary | ICD-10-CM

## 2019-10-07 DIAGNOSIS — Z6821 Body mass index (BMI) 21.0-21.9, adult: Secondary | ICD-10-CM

## 2019-10-07 DIAGNOSIS — Z79899 Other long term (current) drug therapy: Secondary | ICD-10-CM

## 2019-10-07 DIAGNOSIS — Z8719 Personal history of other diseases of the digestive system: Secondary | ICD-10-CM

## 2019-10-07 DIAGNOSIS — Z130 Encounter for screening for diseases of the blood and blood-forming organs and certain disorders involving the immune mechanism: Secondary | ICD-10-CM

## 2019-10-07 DIAGNOSIS — M26609 Unspecified temporomandibular joint disorder, unspecified side: Secondary | ICD-10-CM

## 2019-10-07 DIAGNOSIS — E782 Mixed hyperlipidemia: Secondary | ICD-10-CM

## 2019-10-07 DIAGNOSIS — Z1329 Encounter for screening for other suspected endocrine disorder: Secondary | ICD-10-CM

## 2019-10-07 DIAGNOSIS — Z8742 Personal history of other diseases of the female genital tract: Secondary | ICD-10-CM

## 2019-10-07 MED ORDER — HYOSCYAMINE SULFATE 0.125 MG SL SUBL: 0.1250 mg | SUBLINGUAL_TABLET | SUBLINGUAL | 2 refills | Status: DC | PRN

## 2019-10-07 NOTE — Patient Instructions (Addendum)
Alyssa Young , Thank you for taking time to come for your Annual Wellness Visit. I appreciate your ongoing commitment to your health goals. Please review the following plan we discussed and let me know if I can assist you in the future.   These are the goals we discussed: Goals    . Exercise 150 min/wk Moderate Activity    . LDL CALC < 100       This is a list of the screening recommended for you and due dates:  Health Maintenance  Topic Date Due  .  Hepatitis C: One time screening is recommended by Center for Disease Control  (CDC) for  adults born from [birth year] through 1964.   Never done  . COVID-19 Vaccine (1) Never done  . Pap Smear  10/15/2019  . Flu Shot  11/15/2019  . Tetanus Vaccine  01/14/2022  . HIV Screening  Completed    Consider soluble fiber supplement daily - may help with IBS and also cholesterol.   Know what a healthy weight is for you (roughly BMI <25) and aim to maintain this  Aim for 7+ servings of fruits and vegetables daily  65-80+ fluid ounces of water or unsweet tea for healthy kidneys  Limit to max 1 drink of alcohol per day; avoid smoking/tobacco  Limit animal fats in diet for cholesterol and heart health - choose grass fed whenever available  Avoid highly processed foods, and foods high in saturated/trans fats  Aim for low stress - take time to unwind and care for your mental health  Aim for 150 min of moderate intensity exercise weekly for heart health, and weights twice weekly for bone health  Aim for 7-9 hours of sleep daily   A great goal to work towards is aiming to get in a serving daily of some of the most nutritionally dense foods - G- BOMBS daily       Preventing High Cholesterol Cholesterol is a white, waxy substance similar to fat that the human body needs to help build cells. The liver makes all the cholesterol that a person's body needs. Having high cholesterol (hypercholesterolemia) increases a person's risk for heart disease and  stroke. Extra (excess) cholesterol comes from the food the person eats. High cholesterol can often be prevented with diet and lifestyle changes. If you already have high cholesterol, you can control it with diet and lifestyle changes and with medicine. How can high cholesterol affect me? If you have high cholesterol, deposits (plaques) may build up on the walls of your arteries. The arteries are the blood vessels that carry blood away from your heart. Plaques make the arteries narrower and stiffer. This can limit or block blood flow and cause blood clots to form. Blood clots:  Are tiny balls of cells that form in your blood.  Can move to the heart or brain, causing a heart attack or stroke. Plaques in arteries greatly increase your risk for heart attack and stroke.Making diet and lifestyle changes can reduce your risk for these conditions that may threaten your life. What can increase my risk? This condition is more likely to develop in people who:  Eat foods that are high in saturated fat or cholesterol. Saturated fat is mostly found in: ? Foods that contain animal fat, such as red meat and some dairy products. ? Certain fatty foods made from plants, such as tropical oils.  Are overweight.  Are not getting enough exercise.  Have a family history of high cholesterol. What actions  can I take to prevent this? Nutrition   Eat less saturated fat.  Avoid trans fats (partially hydrogenated oils). These are often found in margarine and in some baked goods, fried foods, and snacks bought in packages.  Avoid precooked or cured meat, such as sausages or meat loaves.  Avoid foods and drinks that have added sugars.  Eat more fruits, vegetables, and whole grains.  Choose healthy sources of protein, such as fish, poultry, lean cuts of red meat, beans, peas, lentils, and nuts.  Choose healthy sources of fat, such as: ? Nuts. ? Vegetable oils, especially olive oil. ? Fish that have healthy  fats (omega-3 fatty acids), such as mackerel or salmon. The items listed above may not be a complete list of recommended foods and beverages. Contact a dietitian for more information. Lifestyle  Lose weight if you are overweight. Losing 5-10 lb (2.3-4.5 kg) can help prevent or control high cholesterol. It can also lower your risk for diabetes and high blood pressure. Ask your health care provider to help you with a diet and exercise plan to lose weight safely.  Do not use any products that contain nicotine or tobacco, such as cigarettes, e-cigarettes, and chewing tobacco. If you need help quitting, ask your health care provider.  Limit your alcohol intake. ? Do not drink alcohol if:  Your health care provider tells you not to drink.  You are pregnant, may be pregnant, or are planning to become pregnant. ? If you drink alcohol:  Limit how much you use to:  0-1 drink a day for women.  0-2 drinks a day for men.  Be aware of how much alcohol is in your drink. In the [place] one drink equals one 12 oz bottle of beer (355 mL), one 5 oz glass of wine (148 mL), or one 1 oz glass of hard liquor (44 mL). Activity   Get enough exercise. Each week, do at least 150 minutes of exercise that takes a medium level of effort (moderate-intensity exercise). ? This is exercise that:  Makes your heart beat faster and makes you breathe harder than usual.  Allows you to still be able to talk. ? You could exercise in short sessions several times a day or longer sessions a few times a week. For example, on 5 days each week, you could walk fast or ride your bike 3 times a day for 10 minutes each time.  Do exercises as told by your health care provider. Medicines  In addition to diet and lifestyle changes, your health care provider may recommend medicines to help lower cholesterol. This may be a medicine to lower the amount of cholesterol your liver makes. You may need medicine if: ? Diet and lifestyle  changes do not lower your cholesterol enough. ? You have high cholesterol and other risk factors for heart disease or stroke.  Take over-the-counter and prescription medicines only as told by your health care provider. General information  Manage your risk factors for high cholesterol. Talk with your health care provider about all your risk factors and how to lower your risk.  Manage other conditions that you have, such as diabetes or high blood pressure (hypertension).  Have blood tests to check your cholesterol levels at regular points in time as told by your health care provider.  Keep all follow-up visits as told by your health care provider. This is important. Where to find more information  American Heart Association: www.heart.org  National Heart, Lung, and Blood Institute: https://wilson-eaton.com/ Summary  High cholesterol increases your risk for heart disease and stroke. By keeping your cholesterol level low, you can reduce your risk for these conditions.  High cholesterol can often be prevented with diet and lifestyle changes.  Work with your health care provider to manage your risk factors, and have your blood tested regularly. This information is not intended to replace advice given to you by your health care provider. Make sure you discuss any questions you have with your health care provider. Document Revised: 07/25/2018 Document Reviewed: 12/10/2015 Elsevier Patient Education  2020 [employer].

## 2019-10-08 LAB — CBC WITH DIFFERENTIAL/PLATELET
Absolute Monocytes: 208 cells/uL (ref 200–950)
Basophils Absolute: 29 cells/uL (ref 0–200)
Basophils Relative: 0.9 %
Eosinophils Absolute: 19 cells/uL (ref 15–500)
Eosinophils Relative: 0.6 %
HCT: 38.7 % (ref 35.0–45.0)
Hemoglobin: 13 g/dL (ref 11.7–15.5)
Lymphs Abs: 1066 cells/uL (ref 850–3900)
MCH: 29.8 pg (ref 27.0–33.0)
MCHC: 33.6 g/dL (ref 32.0–36.0)
MCV: 88.8 fL (ref 80.0–100.0)
MPV: 12.8 fL — ABNORMAL HIGH (ref 7.5–12.5)
Monocytes Relative: 6.5 %
Neutro Abs: 1878 cells/uL (ref 1500–7800)
Neutrophils Relative %: 58.7 %
Platelets: 170 10*3/uL (ref 140–400)
RBC: 4.36 10*6/uL (ref 3.80–5.10)
RDW: 12.8 % (ref 11.0–15.0)
Total Lymphocyte: 33.3 %
WBC: 3.2 10*3/uL — ABNORMAL LOW (ref 3.8–10.8)

## 2019-10-08 LAB — URINALYSIS, ROUTINE W REFLEX MICROSCOPIC
Bacteria, UA: NONE SEEN /HPF
Bilirubin Urine: NEGATIVE
Glucose, UA: NEGATIVE
Hyaline Cast: NONE SEEN /LPF
Ketones, ur: NEGATIVE
Leukocytes,Ua: NEGATIVE
Nitrite: NEGATIVE
Protein, ur: NEGATIVE
RBC / HPF: NONE SEEN /HPF (ref 0–2)
Specific Gravity, Urine: 1.019 (ref 1.001–1.03)
Squamous Epithelial / HPF: NONE SEEN /HPF (ref <=5)
WBC, UA: NONE SEEN /HPF (ref 0–5)
pH: 5.5 (ref 5.0–8.0)

## 2019-10-08 LAB — COMPLETE METABOLIC PANEL WITH GFR
AG Ratio: 2.1 (calc) (ref 1.0–2.5)
ALT: 16 U/L (ref 6–29)
AST: 17 U/L (ref 10–30)
Albumin: 4.6 g/dL (ref 3.6–5.1)
Alkaline phosphatase (APISO): 52 U/L (ref 31–125)
BUN: 15 mg/dL (ref 7–25)
CO2: 29 mmol/L (ref 20–32)
Calcium: 9.3 mg/dL (ref 8.6–10.2)
Chloride: 105 mmol/L (ref 98–110)
Creat: 0.85 mg/dL (ref 0.50–1.10)
GFR, Est African American: 97 mL/min/{1.73_m2} (ref >=60)
GFR, Est Non African American: 84 mL/min/{1.73_m2} (ref >=60)
Globulin: 2.2 g/dL (calc) (ref 1.9–3.7)
Glucose, Bld: 82 mg/dL (ref 65–99)
Potassium: 4.1 mmol/L (ref 3.5–5.3)
Sodium: 140 mmol/L (ref 135–146)
Total Bilirubin: 0.5 mg/dL (ref 0.2–1.2)
Total Protein: 6.8 g/dL (ref 6.1–8.1)

## 2019-10-08 LAB — LIPID PANEL
Cholesterol: 183 mg/dL (ref <200)
HDL: 52 mg/dL (ref >=50)
LDL Cholesterol (Calc): 115 mg/dL (calc) — ABNORMAL HIGH
Non-HDL Cholesterol (Calc): 131 mg/dL (calc) — ABNORMAL HIGH (ref <130)
Total CHOL/HDL Ratio: 3.5 (calc) (ref <5.0)
Triglycerides: 67 mg/dL (ref <150)

## 2019-10-08 LAB — MAGNESIUM: Magnesium: 2.1 mg/dL (ref 1.5–2.5)

## 2019-10-08 LAB — TSH: TSH: 1.82 mIU/L

## 2019-10-08 LAB — VITAMIN D 25 HYDROXY (VIT D DEFICIENCY, FRACTURES): Vit D, 25-Hydroxy: 26 ng/mL — ABNORMAL LOW (ref 30–100)

## 2020-05-10 ENCOUNTER — Other Ambulatory Visit: Payer: Self-pay | Admitting: Obstetrics and Gynecology

## 2020-05-10 DIAGNOSIS — N63 Unspecified lump in unspecified breast: Secondary | ICD-10-CM

## 2020-06-21 ENCOUNTER — Other Ambulatory Visit: Payer: 59

## 2020-10-06 ENCOUNTER — Encounter: Payer: 59 | Admitting: Adult Health

## 2020-10-06 ENCOUNTER — Other Ambulatory Visit: Payer: Self-pay

## 2020-10-06 ENCOUNTER — Encounter: Payer: Self-pay | Admitting: Adult Health

## 2020-10-06 ENCOUNTER — Ambulatory Visit (INDEPENDENT_AMBULATORY_CARE_PROVIDER_SITE_OTHER): Payer: 59 | Admitting: Adult Health

## 2020-10-06 VITALS — BP 108/68 | HR 81 | Temp 96.8°F | Ht 65.5 in | Wt 132.6 lb

## 2020-10-06 DIAGNOSIS — Z1159 Encounter for screening for other viral diseases: Secondary | ICD-10-CM

## 2020-10-06 DIAGNOSIS — Z0000 Encounter for general adult medical examination without abnormal findings: Secondary | ICD-10-CM

## 2020-10-06 DIAGNOSIS — Z8742 Personal history of other diseases of the female genital tract: Secondary | ICD-10-CM

## 2020-10-06 DIAGNOSIS — E559 Vitamin D deficiency, unspecified: Secondary | ICD-10-CM

## 2020-10-06 DIAGNOSIS — E782 Mixed hyperlipidemia: Secondary | ICD-10-CM

## 2020-10-06 DIAGNOSIS — Z79899 Other long term (current) drug therapy: Secondary | ICD-10-CM

## 2020-10-06 DIAGNOSIS — Z1329 Encounter for screening for other suspected endocrine disorder: Secondary | ICD-10-CM

## 2020-10-06 DIAGNOSIS — Z8679 Personal history of other diseases of the circulatory system: Secondary | ICD-10-CM

## 2020-10-06 DIAGNOSIS — M26609 Unspecified temporomandibular joint disorder, unspecified side: Secondary | ICD-10-CM

## 2020-10-06 DIAGNOSIS — G4700 Insomnia, unspecified: Secondary | ICD-10-CM

## 2020-10-06 DIAGNOSIS — Z8719 Personal history of other diseases of the digestive system: Secondary | ICD-10-CM

## 2020-10-06 DIAGNOSIS — Z7721 Contact with and (suspected) exposure to potentially hazardous body fluids: Secondary | ICD-10-CM

## 2020-10-06 DIAGNOSIS — Z1389 Encounter for screening for other disorder: Secondary | ICD-10-CM

## 2020-10-06 NOTE — Patient Instructions (Signed)
Ms. Creason , Thank you for taking time to come for your Annual Wellness Visit. I appreciate your ongoing commitment to your health goals. Please review the following plan we discussed and let me know if I can assist you in the future.   These are the goals we discussed:  Goals      Exercise 150 min/wk Moderate Activity     LDL CALC < 100        This is a list of the screening recommended for you and due dates:  Health Maintenance  Topic Date Due   Hepatitis C Screening: USPSTF Recommendation to screen - Ages 60-79 yo.  Never done   Pneumococcal Vaccination (1 - PCV) 10/06/2041*   Flu Shot  11/14/2020   Pap Smear  08/25/2021   Tetanus Vaccine  01/14/2022   COVID-19 Vaccine  Completed   HIV Screening  Completed   HPV Vaccine  Aged Out  *Topic was postponed. The date shown is not the original due date.     Know what a healthy weight is for you (roughly BMI <25) and aim to maintain this  Aim for 7+ servings of fruits and vegetables daily  65-80+ fluid ounces of water or unsweet tea for healthy kidneys  Limit to max 1 drink of alcohol per day; avoid smoking/tobacco  Limit animal fats in diet for cholesterol and heart health - choose grass fed whenever available  Avoid highly processed foods, and foods high in saturated/trans fats  Aim for low stress - take time to unwind and care for your mental health  Aim for 150 min of moderate intensity exercise weekly for heart health, and weights twice weekly for bone health  Aim for 7-9 hours of sleep daily   Can try  benadryl 25-50mg  at night for sleep.  If this does not help we can try prescription medication - trazodone  Also here is some information about good sleep hygiene.   Insomnia Insomnia is frequent trouble falling and/or staying asleep. Insomnia can be a long term problem or a short term problem. Both are common. Insomnia can be a short term problem when the wakefulness is related to a certain stress or worry.  Long term insomnia is often related to ongoing stress during waking hours and/or poor sleeping habits. Overtime, sleep deprivation itself can make the problem worse. Every little thing feels more severe because you are overtired and your ability to cope is decreased. CAUSES  Stress, anxiety, and depression. Poor sleeping habits. Distractions such as TV in the bedroom. Naps close to bedtime. Engaging in emotionally charged conversations before bed. Technical reading before sleep. Alcohol and other sedatives. They may make the problem worse. They can hurt normal sleep patterns and normal dream activity. Stimulants such as caffeine for several hours prior to bedtime. Pain syndromes and shortness of breath can cause insomnia. Exercise late at night. Changing time zones may cause sleeping problems (jet lag). It is sometimes helpful to have someone observe your sleeping patterns. They should look for periods of not breathing during the night (sleep apnea). They should also look to see how long those periods last. If you live alone or observers are uncertain, you can also be observed at a sleep clinic where your sleep patterns will be professionally monitored. Sleep apnea requires a checkup and treatment. Give your caregivers your medical history. Give your caregivers observations your family has made about your sleep.  SYMPTOMS  Not feeling rested in the morning. Anxiety and restlessness at bedtime.  Difficulty falling and staying asleep. TREATMENT  Your caregiver may prescribe treatment for an underlying medical disorders. Your caregiver can give advice or help if you are using alcohol or other drugs for self-medication. Treatment of underlying problems will usually eliminate insomnia problems. Medications can be prescribed for short time use. They are generally not recommended for lengthy use. Over-the-counter sleep medicines are not recommended for lengthy use. They can be habit forming. You can  promote easier sleeping by making lifestyle changes such as: Using relaxation techniques that help with breathing and reduce muscle tension. Exercising earlier in the day. Changing your diet and the time of your last meal. No night time snacks. Establish a regular time to go to bed. Counseling can help with stressful problems and worry. Soothing music and white noise may be helpful if there are background noises you cannot remove. Stop tedious detailed work at least one hour before bedtime. HOME CARE INSTRUCTIONS  Keep a diary. Inform your caregiver about your progress. This includes any medication side effects. See your caregiver regularly. Take note of: Times when you are asleep. Times when you are awake during the night. The quality of your sleep. How you feel the next day. This information will help your caregiver care for you. Get out of bed if you are still awake after 15 minutes. Read or do some quiet activity. Keep the lights down. Wait until you feel sleepy and go back to bed. Keep regular sleeping and waking hours. Avoid naps. Exercise regularly. Avoid distractions at bedtime. Distractions include watching television or engaging in any intense or detailed activity like attempting to balance the household checkbook. Develop a bedtime ritual. Keep a familiar routine of bathing, brushing your teeth, climbing into bed at the same time each night, listening to soothing music. Routines increase the success of falling to sleep faster. Use relaxation techniques. This can be using breathing and muscle tension release routines. It can also include visualizing peaceful scenes. You can also help control troubling or intruding thoughts by keeping your mind occupied with boring or repetitive thoughts like the old concept of counting sheep. You can make it more creative like imagining planting one beautiful flower after another in your backyard garden. During your day, work to eliminate stress. When  this is not possible use some of the previous suggestions to help reduce the anxiety that accompanies stressful situations. MAKE SURE YOU:  Understand these instructions. Will watch your condition. Will get help right away if you are not doing well or get worse. Document Released: 03/30/2000 Document Revised: 06/25/2011 Document Reviewed: 04/30/2007 [facility] Patient Information 2015 [place], [place]. This information is not intended to replace advice given to you by your health care provider. Make sure you discuss any questions you have with your health care provider.

## 2020-10-06 NOTE — Progress Notes (Signed)
Complete Physical  Assessment and Plan:  Routine general medical examination at a health care facility Health screening and lifestyle reviewed   History of PCOS Weights managed Followed by GYN  History of IBS Controlled by diet and hyoscyamine PRN  Mixed hyperlipidemia Currently managed by lifestyle Continue low cholesterol diet and exercise.  -     TSH -     Lipid panel  Vitamin D deficiency Continue supplementation for goal of 60-100 -     VITAMIN D 25 Hydroxy (Vit-D Deficiency, Fractures)  BMI 21 Continue to recommend diet heavy in fruits and veggies and low in animal meats, cheeses, and dairy products, appropriate calorie intake Discuss exercise recommendations routinely Continue to monitor weight at each visit  TMJ Follow up dentist/oral surgeon as recommended, recently improved  Hep C screening/blood exposure Nurse with blood exposures, will do one time screening after shared decision making  Insomnia - good sleep hygiene discussed, increase day time activity, try benadryl if this does not help we will call in sleep medication. Discussed trazodone as possible option.  - TS   Orders Placed This Encounter  Procedures   Hepatitis C antibody   CBC with Differential/Platelet   COMPLETE METABOLIC PANEL WITH GFR   Magnesium   Lipid panel   TSH   Urinalysis, Routine w reflex microscopic    Discussed med's effects and SE's. Screening labs and tests as requested with regular follow-up as recommended. Over 40 minutes of exam, counseling, chart review, and complex, high level critical decision making was performed this visit.   Future Appointments  Date Time Provider Department Center  10/06/2021 10:00 AM Judd Gaudier, NP [facility] None    HPI  45 y.o. female  presents for a complete physical and follow up for has History of PCOS; History of IBS; Mixed hyperlipidemia; Vitamin D deficiency; TMJ disease; and History of mitral valve prolapse on their problem  list.   Married, works as Tax adviser, 3 kids 8, 14, 17.   Has history of PCOS, goes to see GYN annually, saw on 07/28/2020. Getting annual mammograms. Have menses. Husband with vasectomy.   Has dx of IBS D/C, saw GI in 20s, controlled by lifestyle modification and uses hyoscyamine PRN, flares with stress or when out of routine.   She will wake up with headaches, has seen her dentist, TMJ. She states sx have improved, mild. Has been having some trouble waking up too early, difficulty falling back asleep.   BMI is Body mass index is 21.73 kg/m., she has not been working on diet and exercise.  Has been cooking at home, loves fruit.  Drinks only water, very rarely tea or juice. Minimal 3+ bottles daily  Sleeps 5-6 hours every evening since having trouble falling back asleep.  Wt Readings from Last 3 Encounters:  10/06/20 132 lb 9.6 oz (60.1 kg)  10/07/19 131 lb 9.6 oz (59.7 kg)  10/06/18 132 lb (59.9 kg)   Today their BP is BP: 108/68  She does workout. She denies chest pain, shortness of breath, dizziness.  She is not on cholesterol medication and denies myalgias. MGM had CVAs late in life. Her cholesterol is not at goal. The cholesterol last visit was:   Lab Results  Component Value Date   CHOL 183 10/07/2019   HDL 52 10/07/2019   LDLCALC 115 (H) 10/07/2019   TRIG 67 10/07/2019   CHOLHDL 3.5 10/07/2019    Last A1C in the office was:  Lab Results  Component Value Date  HGBA1C 5.2 10/06/2018   Last GFR: Lab Results  Component Value Date   GFRNONAA 84 10/07/2019   Patient is on cholecalciferol 4000 IU but admits hasn't been taking.  Lab Results  Component Value Date   VD25OH 26 (L) 10/07/2019        Current Medications:  Current Outpatient Medications on File Prior to Visit  Medication Sig Dispense Refill   guaiFENesin (MUCINEX PO) Take by mouth as needed.      hyoscyamine (LEVSIN SL) 0.125 MG SL tablet Place 1 tablet (0.125 mg total) under the tongue every 4 (four)  hours as needed. 60 tablet 2   IBUPROFEN PO Take by mouth as needed.     Loratadine (CLARITIN PO) Take by mouth as needed.     Cholecalciferol (VITAMIN D) 2000 units tablet Take 4,000 Units by mouth daily.  (Patient not taking: Reported on 10/06/2020)     No current facility-administered medications on file prior to visit.   Allergies:  Allergies  Allergen Reactions   Allegra [Fexofenadine Hcl]    Medical History:  She has History of PCOS; History of IBS; Mixed hyperlipidemia; Vitamin D deficiency; TMJ disease; and History of mitral valve prolapse on their problem list.   Health Maintenance:   Immunization History  Administered Date(s) Administered   Influenza-Unspecified 02/13/2019   PFIZER(Purple Top)SARS-COV-2 Vaccination 05/01/2019, 05/30/2019   PPD Test 08/05/2015   Td 10/06/2001   Tdap 01/15/2012   Tetanus: 2013 Pneumovax: Prevnar 13:  Flu vaccine: 02/2020 at [facility] Zostavax: n/a Covid 19: 2/2, 2021, pfizer + booster - is documented   Pap: never abnormal, follows GYN, had PAP 08/26/2018 MGM: 05/23/2020, has annually by GYN, dense DEXA: n/a  Colonoscopy: n/a EGD: n/a  Dental: Dr. Genia Del, 2021, goes q35m Vision: Dr. Emily Filbert, 2022, glasses  Patient Care Team: Lucky Cowboy, MD as PCP - General (Internal Medicine) Marcelle Overlie, MD as Consulting Physician (Obstetrics and Gynecology) Elmon Else, MD as Consulting Physician (Dermatology) Manning Charity, OD as Referring Physician (Optometry)  Surgical History:  She has a past surgical history that includes Wisdom tooth extraction. Family History:  Herfamily history includes Arthritis in her brother; Asthma in her son; Cancer in her father, maternal grandfather, and paternal grandmother; Diabetes in her mother; Endometrial cancer (age of onset: 36) in her maternal aunt; Hyperlipidemia in her mother; Leukemia in her father; Stroke in her maternal grandfather and maternal grandmother. Social History:  She reports that she  has never smoked. She has never used smokeless tobacco. She reports that she does not drink alcohol and does not use drugs.  Review of Systems: Review of Systems  Constitutional:  Negative for malaise/fatigue and weight loss.  HENT:  Negative for hearing loss and tinnitus.   Eyes:  Negative for blurred vision and double vision.  Respiratory:  Negative for cough, sputum production, shortness of breath and wheezing.   Cardiovascular:  Negative for chest pain, palpitations, orthopnea, claudication, leg swelling and PND.  Gastrointestinal:  Positive for constipation and diarrhea. Negative for abdominal pain, blood in stool, heartburn, melena, nausea and vomiting.       Intermittent occasional diarrhea/constipation associated with stress (IBS)  Genitourinary: Negative.   Musculoskeletal: Negative.  Negative for falls, joint pain and myalgias.  Skin:  Negative for rash.  Neurological:  Negative for dizziness, tingling, sensory change, weakness and headaches.  Endo/Heme/Allergies:  Negative for polydipsia.  Psychiatric/Behavioral:  Negative for depression, memory loss, substance abuse and suicidal ideas. The patient has insomnia (difficulty falling back asleep). The patient is  not nervous/anxious.   All other systems reviewed and are negative.  Physical Exam: Estimated body mass index is 21.73 kg/m as calculated from the following:   Height as of this encounter: 5' 5.5" (1.664 m).   Weight as of this encounter: 132 lb 9.6 oz (60.1 kg). BP 108/68   Pulse 81   Temp (!) 96.8 F (36 C)   Ht 5' 5.5" (1.664 m)   Wt 132 lb 9.6 oz (60.1 kg)   SpO2 97%   BMI 21.73 kg/m  General Appearance: Well nourished, in no apparent distress.  Eyes: PERRLA, EOMs, conjunctiva no swelling or erythema Sinuses: No Frontal/maxillary tenderness  ENT/Mouth: Ext aud canals clear, normal light reflex with TMs without erythema, bulging. Good dentition. No erythema, swelling, or exudate on post pharynx. Tonsils not  swollen or erythematous. Hearing normal. + TMJ, no abscess, good dentition Neck: Supple, thyroid normal. No bruits  Respiratory: Respiratory effort normal, BS equal bilaterally without rales, rhonchi, wheezing or stridor.  Cardio: RRR without murmurs, rubs or gallops. Brisk peripheral pulses without edema.  Chest: symmetric, with normal excursions and percussion.  Breasts: Defer to GYN Abdomen: Soft, nontender at this time, no guarding, rebound, hernias, masses, or organomegaly.  Lymphatics: Non tender without lymphadenopathy.  Genitourinary: Defer to GYN Musculoskeletal: Full ROM all peripheral extremities,5/5 strength, and normal gait.  Skin: Warm, dry without rashes, lesions, ecchymosis. Numerous nevi of benign appearance.  Neuro: Cranial nerves intact, reflexes equal bilaterally. Normal muscle tone, no cerebellar symptoms. Sensation intact.  Psych: Awake and oriented X 3, normal affect, Insight and Judgment appropriate.   EKG: WNL in 2020, no concerns, low risk, defer  Dan Maker 10:33 AM [facility]

## 2020-10-07 ENCOUNTER — Encounter: Payer: Self-pay | Admitting: Adult Health

## 2020-10-07 DIAGNOSIS — R809 Proteinuria, unspecified: Secondary | ICD-10-CM | POA: Insufficient documentation

## 2020-10-07 LAB — CBC WITH DIFFERENTIAL/PLATELET
Absolute Monocytes: 320 cells/uL (ref 200–950)
Basophils Absolute: 28 cells/uL (ref 0–200)
Basophils Relative: 0.6 %
Eosinophils Absolute: 28 cells/uL (ref 15–500)
Eosinophils Relative: 0.6 %
HCT: 39.2 % (ref 35.0–45.0)
Hemoglobin: 13.1 g/dL (ref 11.7–15.5)
Lymphs Abs: 1405 cells/uL (ref 850–3900)
MCH: 29.7 pg (ref 27.0–33.0)
MCHC: 33.4 g/dL (ref 32.0–36.0)
MCV: 88.9 fL (ref 80.0–100.0)
MPV: 12.8 fL — ABNORMAL HIGH (ref 7.5–12.5)
Monocytes Relative: 6.8 %
Neutro Abs: 2919 cells/uL (ref 1500–7800)
Neutrophils Relative %: 62.1 %
Platelets: 172 10*3/uL (ref 140–400)
RBC: 4.41 10*6/uL (ref 3.80–5.10)
RDW: 12.5 % (ref 11.0–15.0)
Total Lymphocyte: 29.9 %
WBC: 4.7 10*3/uL (ref 3.8–10.8)

## 2020-10-07 LAB — HEPATITIS C ANTIBODY
Hepatitis C Ab: NONREACTIVE
SIGNAL TO CUT-OFF: 0.01 (ref <1.00)

## 2020-10-07 LAB — COMPLETE METABOLIC PANEL WITH GFR
AG Ratio: 2.4 (calc) (ref 1.0–2.5)
ALT: 18 U/L (ref 6–29)
AST: 20 U/L (ref 10–30)
Albumin: 4.7 g/dL (ref 3.6–5.1)
Alkaline phosphatase (APISO): 49 U/L (ref 31–125)
BUN: 9 mg/dL (ref 7–25)
CO2: 28 mmol/L (ref 20–32)
Calcium: 9.4 mg/dL (ref 8.6–10.2)
Chloride: 108 mmol/L (ref 98–110)
Creat: 0.85 mg/dL (ref 0.50–1.10)
GFR, Est African American: 97 mL/min/{1.73_m2} (ref >=60)
GFR, Est Non African American: 83 mL/min/{1.73_m2} (ref >=60)
Globulin: 2 g/dL (calc) (ref 1.9–3.7)
Glucose, Bld: 79 mg/dL (ref 65–99)
Potassium: 4 mmol/L (ref 3.5–5.3)
Sodium: 142 mmol/L (ref 135–146)
Total Bilirubin: 0.5 mg/dL (ref 0.2–1.2)
Total Protein: 6.7 g/dL (ref 6.1–8.1)

## 2020-10-07 LAB — LIPID PANEL
Cholesterol: 203 mg/dL — ABNORMAL HIGH (ref <200)
HDL: 51 mg/dL (ref >=50)
LDL Cholesterol (Calc): 135 mg/dL (calc) — ABNORMAL HIGH
Non-HDL Cholesterol (Calc): 152 mg/dL (calc) — ABNORMAL HIGH (ref <130)
Total CHOL/HDL Ratio: 4 (calc) (ref <5.0)
Triglycerides: 71 mg/dL (ref <150)

## 2020-10-07 LAB — URINALYSIS, ROUTINE W REFLEX MICROSCOPIC
Bacteria, UA: NONE SEEN /HPF
Bilirubin Urine: NEGATIVE
Glucose, UA: NEGATIVE
Hgb urine dipstick: NEGATIVE
Hyaline Cast: NONE SEEN /LPF
Ketones, ur: NEGATIVE
Leukocytes,Ua: NEGATIVE
Nitrite: NEGATIVE
RBC / HPF: NONE SEEN /HPF (ref 0–2)
Specific Gravity, Urine: 1.026 (ref 1.001–1.035)
WBC, UA: NONE SEEN /HPF (ref 0–5)
pH: 6.5 (ref 5.0–8.0)

## 2020-10-07 LAB — MICROSCOPIC MESSAGE

## 2020-10-07 LAB — TSH: TSH: 1.4 mIU/L

## 2020-10-07 LAB — MAGNESIUM: Magnesium: 2.1 mg/dL (ref 1.5–2.5)

## 2021-02-16 ENCOUNTER — Ambulatory Visit: Payer: 59 | Admitting: Nurse Practitioner

## 2021-02-17 ENCOUNTER — Ambulatory Visit (INDEPENDENT_AMBULATORY_CARE_PROVIDER_SITE_OTHER): Payer: 59 | Admitting: Nurse Practitioner

## 2021-02-17 ENCOUNTER — Other Ambulatory Visit: Payer: Self-pay

## 2021-02-17 ENCOUNTER — Encounter: Payer: Self-pay | Admitting: Nurse Practitioner

## 2021-02-17 VITALS — BP 106/60 | HR 97 | Temp 97.7°F | Wt 136.0 lb

## 2021-02-17 DIAGNOSIS — Z1152 Encounter for screening for COVID-19: Secondary | ICD-10-CM

## 2021-02-17 DIAGNOSIS — J069 Acute upper respiratory infection, unspecified: Secondary | ICD-10-CM | POA: Diagnosis not present

## 2021-02-17 LAB — POC COVID19 BINAXNOW: SARS Coronavirus 2 Ag: NEGATIVE

## 2021-02-17 MED ORDER — AZITHROMYCIN 250 MG PO TABS: ORAL_TABLET | ORAL | 1 refills | Status: DC

## 2021-02-17 MED ORDER — PROMETHAZINE-DM 6.25-15 MG/5ML PO SYRP: 5.0000 mL | ORAL_SOLUTION | Freq: Four times a day (QID) | ORAL | 1 refills | Status: DC | PRN

## 2021-02-17 NOTE — Patient Instructions (Signed)

## 2021-02-17 NOTE — Progress Notes (Signed)
Assessment and Plan:  Alyssa Young was seen today for acute visit.  Diagnoses and all orders for this visit:  Encounter for screening for COVID-19 -     POC COVID-19  Negative  URI, acute -     azithromycin (ZITHROMAX) 250 MG tablet; Take 2 tablets (500 mg) on  Day 1,  followed by 1 tablet (250 mg) once daily on Days 2 through 5. -     promethazine-dextromethorphan (PROMETHAZINE-DM) 6.25-15 MG/5ML syrup; Take 5 mLs by mouth 4 (four) times daily as needed for cough. Continue Mucinex DM during the day and use Cough syrup at night  Push fluids. Use Tylenol as needed for fevers/aches. Using humidifier will also be helpful.      Further disposition pending results of labs. Discussed med's effects and SE's.   Over 30 minutes of exam, counseling, chart review, and critical decision making was performed.   Future Appointments  Date Time Provider Department Center  04/11/2021 10:30 AM Judd Gaudier, NP [facility] None  10/06/2021 10:00 AM Judd Gaudier, NP [facility] None    ------------------------------------------------------------------------------------------------------------------   HPI BP 106/60   Pulse 97   Temp 97.7 F (36.5 C)   Wt 136 lb (61.7 kg)   SpO2 98%   BMI 22.29 kg/m  45 y.o.female presents for Congestion  She states that 6 days ago she started having a sore throat and was followed by congestion with green sinus drainage and coughing up green mucus when trying to lay down at night. Has been taking Mucinex and advil cold and flu which has helped a little  Denies fever, muscle aches, nausea, vomiting and diarrhea.  Past Medical History:  Diagnosis Date   History of IBS    History of PCOS    Mitral prolapse    takes no anrtibiotics with dental work   Mixed hyperlipidemia 07/27/2013     Allergies  Allergen Reactions   Allegra [Fexofenadine Hcl]     Current Outpatient Medications on File Prior to Visit  Medication Sig   Cholecalciferol (VITAMIN D) 2000  units tablet Take 4,000 Units by mouth daily.  (Patient not taking: Reported on 10/06/2020)   guaiFENesin (MUCINEX PO) Take by mouth as needed.    hyoscyamine (LEVSIN SL) 0.125 MG SL tablet Place 1 tablet (0.125 mg total) under the tongue every 4 (four) hours as needed.   IBUPROFEN PO Take by mouth as needed.   Loratadine (CLARITIN PO) Take by mouth as needed.   No current facility-administered medications on file prior to visit.    ROS: all negative except above.   Physical Exam:  BP 106/60   Pulse 97   Temp 97.7 F (36.5 C)   Wt 136 lb (61.7 kg)   SpO2 98%   BMI 22.29 kg/m   General Appearance: Well nourished, in no apparent distress. Eyes: PERRLA, EOMs, conjunctiva no swelling or erythema Sinuses: No Frontal/maxillary tenderness ENT/Mouth: Ext aud canals clear, TMs dull. No erythema, swelling, or exudate on post pharynx.  Tonsils not swollen or erythematous. Hearing normal.  Neck: Supple, thyroid normal.  Respiratory: Respiratory effort normal, BS equal bilaterally without rales, rhonchi, wheezing or stridor.  Cardio: RRR with no MRGs. Brisk peripheral pulses without edema.  Abdomen: Soft, + BS.  Non tender, no guarding, rebound, hernias, masses. Lymphatics: Non tender without lymphadenopathy.  Musculoskeletal: Full ROM, 5/5 strength, normal gait.  Skin: Warm, dry without rashes, lesions, ecchymosis.  Neuro: Cranial nerves intact. Normal muscle tone, no cerebellar symptoms. Sensation intact.  Psych: Awake and oriented  X 3, normal affect, Insight and Judgment appropriate.     Revonda Humphrey, NP 9:15 AM [facility]

## 2021-04-06 NOTE — Progress Notes (Signed)
Assessment and Plan:  Alyssa Young was seen today for follow-up.  Diagnoses and all orders for this visit:  Mixed hyperlipidemia Currently working on lifestyle Reviewed low saturated fat diet, reduce red meat, increase soluble fiber; try more plant based meals, lean poultry, fish Check lipid panel.  -     COMPLETE METABOLIC PANEL WITH GFR -     Lipid panel  Proteinuria, unspecified type -     Urinalysis, Routine w reflex microscopic -     Microalbumin / creatinine urine ratio  Insomnia, unspecified type - good sleep hygiene discussed, increase day time activity Stress management techniques discussed, increase water, good sleep hygiene discussed, increase exercise, and increase veggies.  - follow up with progress in 1 month  -     traZODone (DESYREL) 50 MG tablet; 1/2-1 tablet for sleep  Vitamin D deficiency Restart supplement, defer recheck today   Further disposition pending results of labs. Discussed med's effects and SE's.   Over 30 minutes of exam, counseling, chart review, and critical decision making was performed.   Future Appointments  Date Time Provider [place]  10/06/2021 10:00 AM Liane Comber, NP [facility] None    ------------------------------------------------------------------------------------------------------------------   HPI BP 104/66    Pulse 82    Temp 97.9 F (36.6 C)    Wt 132 lb (59.9 kg)    SpO2 99%    BMI 21.63 kg/m  45 y.o.female presents for 6 month follow up on cholesterol, vitamin D and also for urine recheck (proteinuria on last check).   Alyssa Young reports ongoing insomnia, having lots of stress at home with son's dating choices, has tried benadryl 25 mg with some benefit but some nights still only gets 4-5 hours, trouble staying asleep with racing thoughts.   BMI is Body mass index is 21.63 kg/m., Alyssa Young has not been working on diet and exercise, does admit to a lot of red meat, deer meat due to son/husband preference.  Wt Readings from  Last 3 Encounters:  04/11/21 132 lb (59.9 kg)  02/17/21 136 lb (61.7 kg)  10/06/20 132 lb 9.6 oz (60.1 kg)   Today their BP is BP: 104/66 Alyssa Young denies chest pain, shortness of breath, dizziness.   Alyssa Young is not on cholesterol medication. MGM had CVAs late in life. Alyssa Young cholesterol is not at goal. Alyssa Young cholesterol is not at goal. The cholesterol last visit was:   Lab Results  Component Value Date   CHOL 203 (H) 10/06/2020   HDL 51 10/06/2020   LDLCALC 135 (H) 10/06/2020   TRIG 71 10/06/2020   CHOLHDL 4.0 10/06/2020   Patient is on Vitamin D supplement.   Lab Results  Component Value Date   VD25OH 26 (L) 10/07/2019       Past Medical History:  Diagnosis Date   History of IBS    History of PCOS    Mitral prolapse    takes no anrtibiotics with dental work   Mixed hyperlipidemia 07/27/2013     Allergies  Allergen Reactions   Allegra [Fexofenadine Hcl]     Current Outpatient Medications on File Prior to Visit  Medication Sig   hyoscyamine (LEVSIN SL) 0.125 MG SL tablet Place 1 tablet (0.125 mg total) under the tongue every 4 (four) hours as needed.   IBUPROFEN PO Take by mouth as needed.   Loratadine (CLARITIN PO) Take by mouth as needed.   Cholecalciferol (VITAMIN D) 2000 units tablet Take 4,000 Units by mouth daily.  (Patient not taking: Reported on 10/06/2020)  No current facility-administered medications on file prior to visit.   Allergies:  Allergies  Allergen Reactions   Allegra [Fexofenadine Hcl]    Surgical History:  Alyssa Young  has a past surgical history that includes Wisdom tooth extraction. Family History:  Alyssa Young history includes Arthritis in Alyssa Young brother; Asthma in Alyssa Young son; Cancer in Alyssa Young father, maternal grandfather, and paternal grandmother; Diabetes in Alyssa Young mother; Endometrial cancer (age of onset: 24) in Alyssa Young maternal aunt; Hyperlipidemia in Alyssa Young mother; Leukemia in Alyssa Young father; Stroke in Alyssa Young maternal grandfather and maternal grandmother. Social History:   reports  that Alyssa Young has never smoked. Alyssa Young has never used smokeless tobacco. Alyssa Young reports that Alyssa Young does not drink alcohol and does not use drugs.   ROS: all negative except above.   Physical Exam:  BP 104/66    Pulse 82    Temp 97.9 F (36.6 C)    Wt 132 lb (59.9 kg)    SpO2 99%    BMI 21.63 kg/m   General Appearance: Well nourished, in no apparent distress. Eyes: PERRLA, EOMs, conjunctiva no swelling or erythema Sinuses: No Frontal/maxillary tenderness ENT/Mouth: Ext aud canals clear, TMs without erythema, bulging. No erythema, swelling, or exudate on post pharynx.  Tonsils not swollen or erythematous. Hearing normal.  Neck: Supple, thyroid normal.  Respiratory: Respiratory effort normal, BS equal bilaterally without rales, rhonchi, wheezing or stridor.  Cardio: RRR with no MRGs. Brisk peripheral pulses without edema.  Abdomen: Soft, + BS.  Non tender, no guarding, rebound, hernias, masses. Lymphatics: Non tender without lymphadenopathy.  Musculoskeletal: Full ROM, 5/5 strength, normal gait.  Skin: Warm, dry without rashes, lesions, ecchymosis.  Neuro: Cranial nerves intact. Normal muscle tone, no cerebellar symptoms. Sensation intact.  Psych: Awake and oriented X 3, normal affect, Insight and Judgment appropriate.     Dan Maker, NP 10:44 AM [facility]

## 2021-04-11 ENCOUNTER — Other Ambulatory Visit: Payer: Self-pay

## 2021-04-11 ENCOUNTER — Ambulatory Visit: Payer: 59 | Admitting: Adult Health

## 2021-04-11 ENCOUNTER — Encounter: Payer: Self-pay | Admitting: Adult Health

## 2021-04-11 VITALS — BP 104/66 | HR 82 | Temp 97.9°F | Wt 132.0 lb

## 2021-04-11 DIAGNOSIS — E782 Mixed hyperlipidemia: Secondary | ICD-10-CM | POA: Diagnosis not present

## 2021-04-11 DIAGNOSIS — R809 Proteinuria, unspecified: Secondary | ICD-10-CM | POA: Diagnosis not present

## 2021-04-11 DIAGNOSIS — G4700 Insomnia, unspecified: Secondary | ICD-10-CM | POA: Diagnosis not present

## 2021-04-11 DIAGNOSIS — E559 Vitamin D deficiency, unspecified: Secondary | ICD-10-CM | POA: Diagnosis not present

## 2021-04-11 MED ORDER — TRAZODONE HCL 50 MG PO TABS: ORAL_TABLET | ORAL | 0 refills | Status: DC

## 2021-04-11 NOTE — Patient Instructions (Addendum)
Insomnia Insomnia is a sleep disorder that makes it difficult to fall asleep or stay asleep. Insomnia can cause fatigue, low energy, difficulty concentrating, mood swings, and poor performance at work or school. There are three different ways to classify insomnia: Difficulty falling asleep. Difficulty staying asleep. Waking up too early in the morning. Any type of insomnia can be long-term (chronic) or short-term (acute). Both are common. Short-term insomnia usually lasts for three months or less. Chronic insomnia occurs at least three times a week for longer than three months. What are the causes? Insomnia may be caused by another condition, situation, or substance, such as: Anxiety. Certain medicines. Gastroesophageal reflux disease (GERD) or other gastrointestinal conditions. Asthma or other breathing conditions. Restless legs syndrome, sleep apnea, or other sleep disorders. Chronic pain. Menopause. Stroke. Abuse of alcohol, tobacco, or illegal drugs. Mental health conditions, such as depression. Caffeine. Neurological disorders, such as Alzheimer's disease. An overactive thyroid (hyperthyroidism). Sometimes, the cause of insomnia may not be known. What increases the risk? Risk factors for insomnia include: Gender. Women are affected more often than men. Age. Insomnia is more common as you get older. Stress. Lack of exercise. Irregular work schedule or working night shifts. Traveling between different time zones. Certain medical and mental health conditions. What are the signs or symptoms? If you have insomnia, the main symptom is having trouble falling asleep or having trouble staying asleep. This may lead to other symptoms, such as: Feeling fatigued or having low energy. Feeling nervous about going to sleep. Not feeling rested in the morning. Having trouble concentrating. Feeling irritable, anxious, or depressed. How is this diagnosed? This condition may be diagnosed  based on: Your symptoms and medical history. Your health care provider may ask about: Your sleep habits. Any medical conditions you have. Your mental health. A physical exam. How is this treated? Treatment for insomnia depends on the cause. Treatment may focus on treating an underlying condition that is causing insomnia. Treatment may also include: Medicines to help you sleep. Counseling or therapy. Lifestyle adjustments to help you sleep better. Follow these instructions at home: Eating and drinking  Limit or avoid alcohol, caffeinated beverages, and cigarettes, especially close to bedtime. These can disrupt your sleep. Do not eat a large meal or eat spicy foods right before bedtime. This can lead to digestive discomfort that can make it hard for you to sleep. Sleep habits  Keep a sleep diary to help you and your health care provider figure out what could be causing your insomnia. Write down: When you sleep. When you wake up during the night. How well you sleep. How rested you feel the next day. Any side effects of medicines you are taking. What you eat and drink. Make your bedroom a dark, comfortable place where it is easy to fall asleep. Put up shades or blackout curtains to block light from outside. Use a white noise machine to block noise. Keep the temperature cool. Limit screen use before bedtime. This includes: Watching TV. Using your smartphone, tablet, or computer. Stick to a routine that includes going to bed and waking up at the same times every day and night. This can help you fall asleep faster. Consider making a quiet activity, such as reading, part of your nighttime routine. Try to avoid taking naps during the day so that you sleep better at night. Get out of bed if you are still awake after 15 minutes of trying to sleep. Keep the lights down, but try reading or doing a  quiet activity. When you feel sleepy, go back to bed. General instructions Take over-the-counter  and prescription medicines only as told by your health care provider. Exercise regularly, as told by your health care provider. Avoid exercise starting several hours before bedtime. Use relaxation techniques to manage stress. Ask your health care provider to suggest some techniques that may work well for you. These may include: Breathing exercises. Routines to release muscle tension. Visualizing peaceful scenes. Make sure that you drive carefully. Avoid driving if you feel very sleepy. Keep all follow-up visits as told by your health care provider. This is important. Contact a health care provider if: You are tired throughout the day. You have trouble in your daily routine due to sleepiness. You continue to have sleep problems, or your sleep problems get worse. Get help right away if: You have serious thoughts about hurting yourself or someone else. If you ever feel like you may hurt yourself or others, or have thoughts about taking your own life, get help right away. You can go to your nearest emergency department or call: Your local emergency services (911 in the [place] A suicide crisis helpline, such as the National Suicide Prevention Lifeline at 225-507-3154 or 988 in the [place] This is open 24 hours a day. Summary Insomnia is a sleep disorder that makes it difficult to fall asleep or stay asleep. Insomnia can be long-term (chronic) or short-term (acute). Treatment for insomnia depends on the cause. Treatment may focus on treating an underlying condition that is causing insomnia. Keep a sleep diary to help you and your health care provider figure out what could be causing your insomnia. This information is not intended to replace advice given to you by your health care provider. Make sure you discuss any questions you have with your health care provider. Document Revised: 10/26/2020 Document Reviewed: 02/11/2020 Elsevier Patient Education  2022 Elsevier Inc.    Trazodone  Tablets What is this medication? TRAZODONE (TRAZ oh done) treats depression and sleep. It increases the amount of serotonin in the brain, a hormone that helps regulate mood. This medicine may be used for other purposes; ask your health care provider or pharmacist if you have questions. COMMON BRAND NAME(S): Desyrel What should I tell my care team before I take this medication? They need to know if you have any of these conditions: Attempted suicide or thinking about it Bipolar disorder Bleeding problems Glaucoma Heart disease, or previous heart attack Irregular heart beat Kidney or liver disease Low levels of sodium in the blood An unusual or allergic reaction to trazodone, other medications, foods, dyes or preservatives Pregnant or trying to get pregnant Breast-feeding How should I use this medication? Take this medication by mouth with a glass of water. Follow the directions on the prescription label. Take this medication shortly after a meal or a light snack. Take your medication at regular intervals. Do not take your medication more often than directed. Do not stop taking this medication suddenly except upon the advice of your care team. Stopping this medication too quickly may cause serious side effects or your condition may worsen. A special MedGuide will be given to you by the pharmacist with each prescription and refill. Be sure to read this information carefully each time. Talk to your care team regarding the use of this medication in children. Special care may be needed. Overdosage: If you think you have taken too much of this medicine contact a poison control center or emergency room at once. NOTE: This medicine  is only for you. Do not share this medicine with others. What if I miss a dose? If you miss a dose, take it as soon as you can. If it is almost time for your next dose, take only that dose. Do not take double or extra doses. What may interact with this medication? Do not  take this medication with any of the following: Certain medications for fungal infections like fluconazole, itraconazole, ketoconazole, posaconazole, voriconazole Cisapride Dronedarone Linezolid MAOIs like Carbex, Eldepryl, Marplan, Nardil, and Parnate Mesoridazine Methylene blue (injected into a vein) Pimozide Saquinavir Thioridazine This medication may also interact with the following: Alcohol Antiviral medications for HIV or AIDS Aspirin and aspirin-like medications Barbiturates like phenobarbital Certain medications for blood pressure, heart disease, irregular heart beat Certain medications for depression, anxiety, or psychotic disturbances Certain medications for migraine headache like almotriptan, eletriptan, frovatriptan, naratriptan, rizatriptan, sumatriptan, zolmitriptan Certain medications for seizures like carbamazepine and phenytoin Certain medications for sleep Certain medications that treat or prevent blood clots like dalteparin, enoxaparin, warfarin Digoxin Fentanyl Lithium NSAIDS, medications for pain and inflammation, like ibuprofen or naproxen Other medications that prolong the QT interval (cause an abnormal heart rhythm) like dofetilide Rasagiline Supplements like St. John's wort, kava kava, valerian Tramadol Tryptophan This list may not describe all possible interactions. Give your health care provider a list of all the medicines, herbs, non-prescription drugs, or dietary supplements you use. Also tell them if you smoke, drink alcohol, or use illegal drugs. Some items may interact with your medicine. What should I watch for while using this medication? Tell your care team if your symptoms do not get better or if they get worse. Visit your care team for regular checks on your progress. Because it may take several weeks to see the full effects of this medication, it is important to continue your treatment as prescribed by your care team. Watch for new or worsening  thoughts of suicide or depression. This includes sudden changes in mood, behaviors, or thoughts. These changes can happen at any time but are more common in the beginning of treatment or after a change in dose. Call your care team right away if you experience these thoughts or worsening depression. Manic episodes may happen in patients with bipolar disorder who take this medication. Watch for changes in feelings or behaviors such as feeling anxious, nervous, agitated, panicky, irritable, hostile, aggressive, impulsive, severely restless, overly excited and hyperactive, or trouble sleeping. These changes can happen at any time but are more common in the beginning of treatment or after a change in dose. Call your care team right away if you notice any of these symptoms. You may get drowsy or dizzy. Do not drive, use machinery, or do anything that needs mental alertness until you know how this medication affects you. Do not stand or sit up quickly, especially if you are an older patient. This reduces the risk of dizzy or fainting spells. Alcohol may interfere with the effect of this medication. Avoid alcoholic drinks. This medication may cause dry eyes and blurred vision. If you wear contact lenses you may feel some discomfort. Lubricating drops may help. See your eye doctor if the problem does not go away or is severe. Your mouth may get dry. Chewing sugarless gum, sucking hard candy and drinking plenty of water may help. Contact your care team if the problem does not go away or is severe. What side effects may I notice from receiving this medication? Side effects that you should report to your  care team as soon as possible: Allergic reactions--skin rash, itching, hives, swelling of the face, lips, tongue, or throat Bleeding--bloody or black, tar-like stools, red or dark brown urine, vomiting blood or brown material that looks like coffee grounds, small, red or purple spots on skin, unusual bleeding or  bruising Heart rhythm changes--fast or irregular heartbeat, dizziness, feeling faint or lightheaded, chest pain, trouble breathing Low blood pressure--dizziness, feeling faint or lightheaded, blurry vision Low sodium level--muscle weakness, fatigue, dizziness, headache, confusion Prolonged or painful erection Serotonin syndrome--irritability, confusion, fast or irregular heartbeat, muscle stiffness, twitching muscles, sweating, high fever, seizures, chills, vomiting, diarrhea Sudden eye pain or change in vision such as blurry vision, seeing halos around lights, vision loss Thoughts of suicide or self-harm, worsening mood, feelings of depression Side effects that usually do not require medical attention (report to your care team if they continue or are bothersome): Change in sex drive or performance Constipation Dizziness Drowsiness Dry mouth This list may not describe all possible side effects. Call your doctor for medical advice about side effects. You may report side effects to FDA at 1-800-FDA-1088. Where should I keep my medication? Keep out of the reach of children and pets. Store at room temperature between 15 and 30 degrees C (59 to 86 degrees F). Protect from light. Keep container tightly closed. Throw away any unused medication after the expiration date. NOTE: This sheet is a summary. It may not cover all possible information. If you have questions about this medicine, talk to your doctor, pharmacist, or health care provider.  2022 Elsevier/Gold Standard (2020-03-23 00:00:00)

## 2021-04-12 ENCOUNTER — Other Ambulatory Visit: Payer: Self-pay | Admitting: Adult Health

## 2021-04-12 DIAGNOSIS — R801 Persistent proteinuria, unspecified: Secondary | ICD-10-CM

## 2021-04-12 LAB — MICROALBUMIN / CREATININE URINE RATIO
Creatinine, Urine: 256 mg/dL (ref 20–275)
Microalb Creat Ratio: 4 mcg/mg creat (ref <30)
Microalb, Ur: 0.9 mg/dL

## 2021-04-12 LAB — COMPLETE METABOLIC PANEL WITH GFR
AG Ratio: 1.9 (calc) (ref 1.0–2.5)
ALT: 10 U/L (ref 6–29)
AST: 14 U/L (ref 10–30)
Albumin: 4.2 g/dL (ref 3.6–5.1)
Alkaline phosphatase (APISO): 48 U/L (ref 31–125)
BUN: 12 mg/dL (ref 7–25)
CO2: 26 mmol/L (ref 20–32)
Calcium: 8.8 mg/dL (ref 8.6–10.2)
Chloride: 109 mmol/L (ref 98–110)
Creat: 0.75 mg/dL (ref 0.50–0.99)
Globulin: 2.2 g/dL (calc) (ref 1.9–3.7)
Glucose, Bld: 79 mg/dL (ref 65–99)
Potassium: 4 mmol/L (ref 3.5–5.3)
Sodium: 142 mmol/L (ref 135–146)
Total Bilirubin: 0.4 mg/dL (ref 0.2–1.2)
Total Protein: 6.4 g/dL (ref 6.1–8.1)
eGFR: 101 mL/min/{1.73_m2} (ref >=60)

## 2021-04-12 LAB — URINALYSIS, ROUTINE W REFLEX MICROSCOPIC
Bacteria, UA: NONE SEEN /HPF
Bilirubin Urine: NEGATIVE
Glucose, UA: NEGATIVE
Hgb urine dipstick: NEGATIVE
Hyaline Cast: NONE SEEN /LPF
Ketones, ur: NEGATIVE
Leukocytes,Ua: NEGATIVE
Nitrite: NEGATIVE
RBC / HPF: NONE SEEN /HPF (ref 0–2)
Specific Gravity, Urine: 1.023 (ref 1.001–1.035)
Squamous Epithelial / HPF: NONE SEEN /HPF (ref <=5)
WBC, UA: NONE SEEN /HPF (ref 0–5)
pH: 7 (ref 5.0–8.0)

## 2021-04-12 LAB — LIPID PANEL
Cholesterol: 180 mg/dL (ref <200)
HDL: 49 mg/dL — ABNORMAL LOW (ref >=50)
LDL Cholesterol (Calc): 115 mg/dL (calc) — ABNORMAL HIGH
Non-HDL Cholesterol (Calc): 131 mg/dL (calc) — ABNORMAL HIGH (ref <130)
Total CHOL/HDL Ratio: 3.7 (calc) (ref <5.0)
Triglycerides: 68 mg/dL (ref <150)

## 2021-04-12 LAB — MICROSCOPIC MESSAGE

## 2021-05-04 ENCOUNTER — Other Ambulatory Visit: Payer: Self-pay | Admitting: Adult Health

## 2021-05-04 DIAGNOSIS — G4700 Insomnia, unspecified: Secondary | ICD-10-CM

## 2021-05-27 ENCOUNTER — Encounter: Payer: Self-pay | Admitting: Adult Health

## 2021-08-03 IMAGING — MG MM DIGITAL DIAGNOSTIC UNILAT*L* W/ TOMO W/ CAD
6 series · 6 of 18 positions shown · non-contrast
Comparison: Previous exam(s).

CLINICAL DATA: 44-year-old female with a palpable area of concern
in the left breast.

EXAM:
DIGITAL DIAGNOSTIC UNILATERAL LEFT MAMMOGRAM WITH CAD AND TOMO
LEFT BREAST ULTRASOUND

[L CC synth-2D]
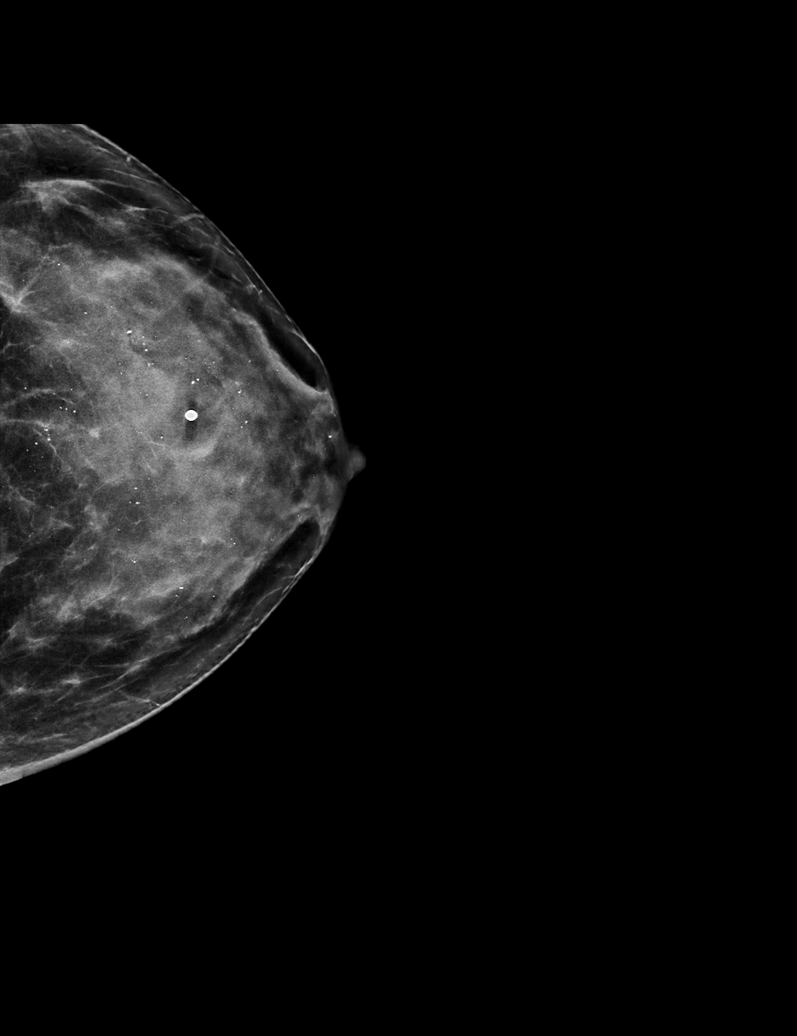

[L MLO synth-2D (1 of 2)]
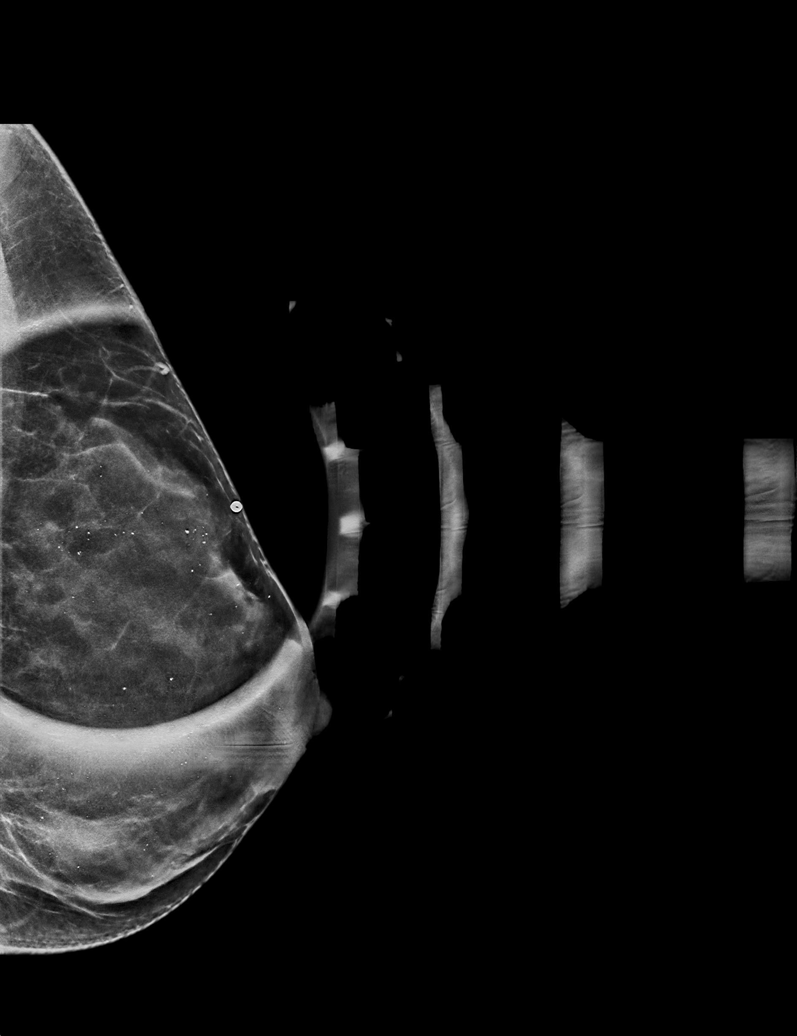

[L MLO synth-2D (2 of 2)]
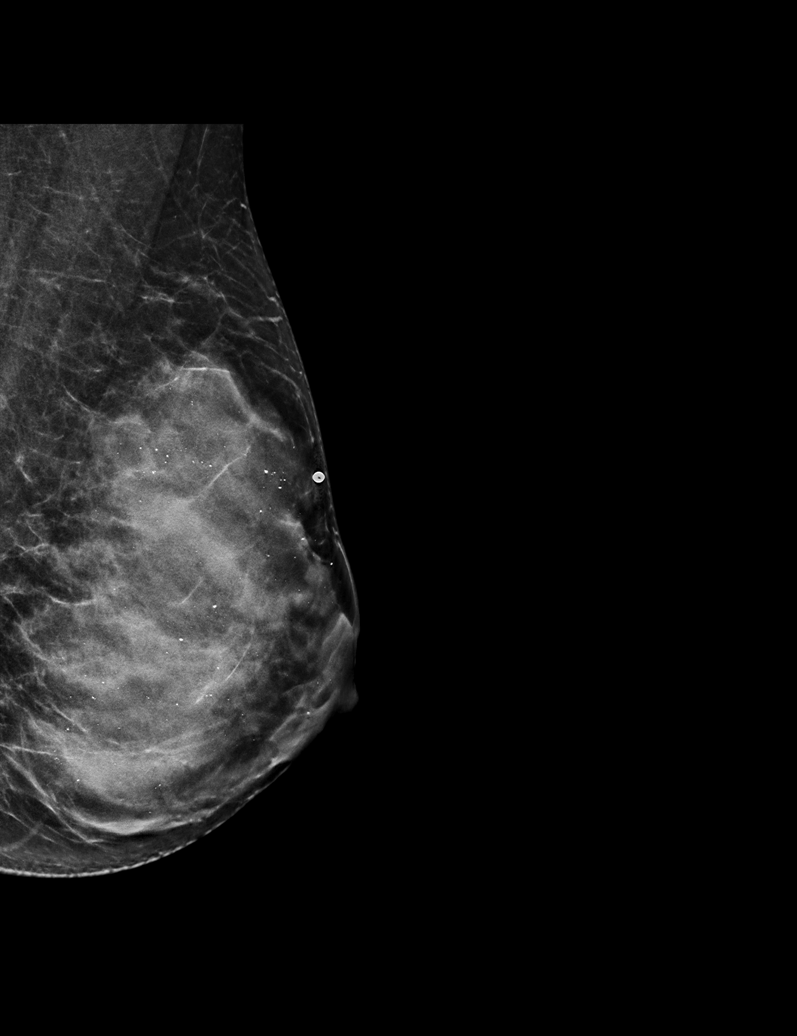

[L CC tomo · tomo slice 30/59.0]
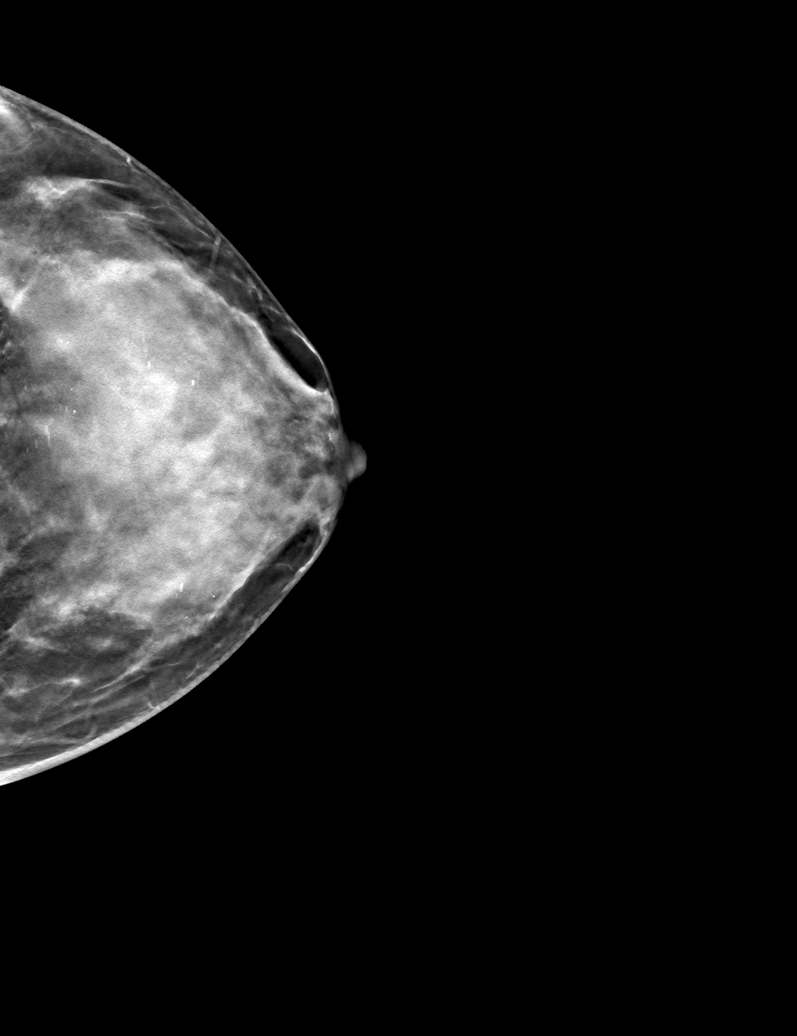

[L MLO tomo (1 of 2) · tomo slice 30/59.0]
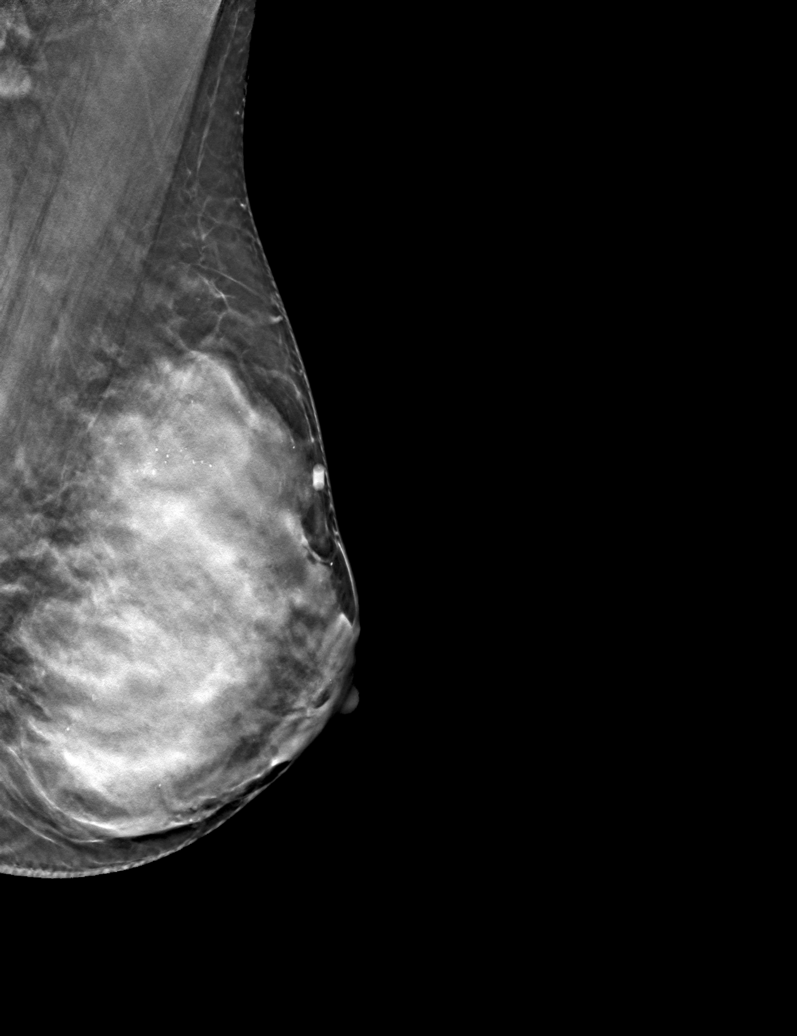

[L MLO tomo (2 of 2) · tomo slice 29/56.0]
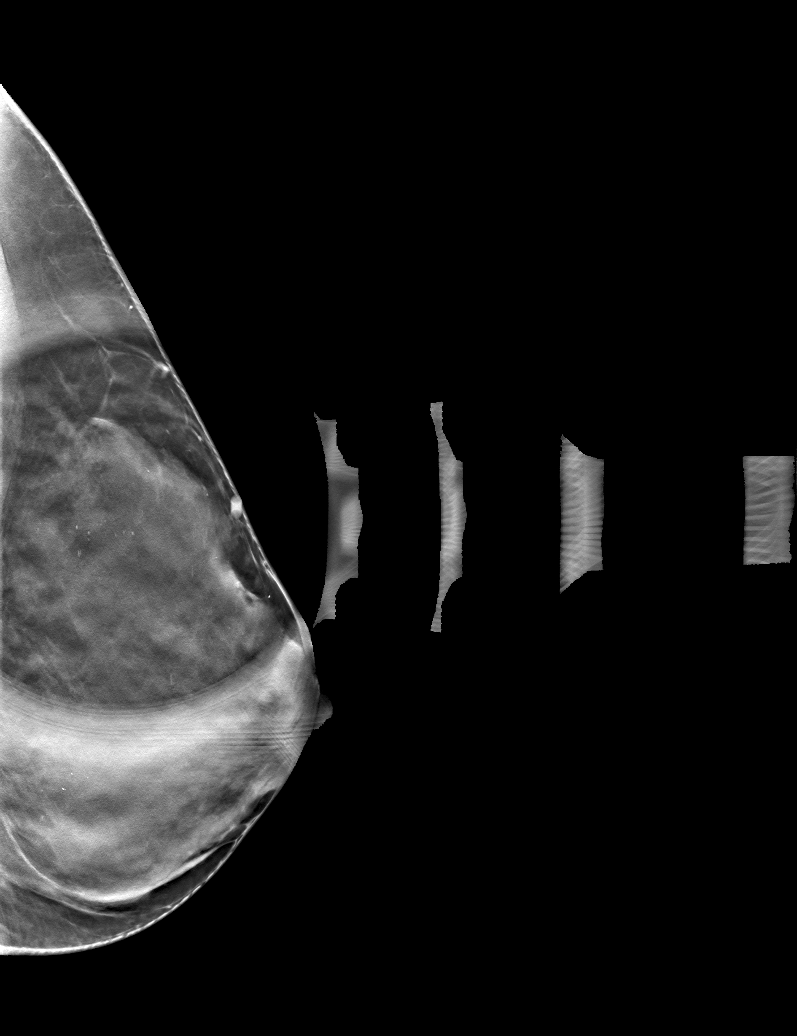

[6 of 18 positions shown; findings below may reference images not displayed]

ACR Breast Density Category d: The breast tissue is extremely dense,
which lowers the sensitivity of mammography.
FINDINGS: Cc and MLO tomograms were performed of the left breast. No
suspicious masses or calcifications seen in the left breast. Spot
compression MLO tomograms were performed over the palpable area of
concern in the upper left breast with no definite abnormality seen.

Mammographic images were processed with CAD.

Physical examination at site of palpable concern in the upper left
breast reveals a firm area of thickening at the 12 o'clock position.

Targeted ultrasound of the left breast was performed. There is a
cyst at 12 o'clock 2 cm from nipple measuring 0.9 x 0.7 x 1 cm. This
corresponds with the area of palpable concern. No suspicious masses
or abnormality seen in the superior left breast.
IMPRESSION: Left breast cyst.  No findings of malignancy in the left breast.

RECOMMENDATION:
Recommend annual routine screening mammography, due August 2019.

I have discussed the findings and recommendations with the patient.
If applicable, a reminder letter will be sent to the patient
regarding the next appointment.

BI-RADS CATEGORY  2: Benign.

## 2021-09-28 ENCOUNTER — Encounter: Payer: Self-pay | Admitting: Internal Medicine

## 2021-10-05 NOTE — Progress Notes (Unsigned)
Complete Physical  Assessment and Plan:  Encounter for Annual Physical Exam with abnormal findings Due annually  Health Maintenance reviewed Healthy lifestyle reviewed and goals set ***  History of PCOS Weights managed Followed by GYN  History of IBS Controlled by diet and hyoscyamine PRN  Mixed hyperlipidemia Currently managed by lifestyle Continue low cholesterol diet and exercise.  -     TSH -     Lipid panel  Vitamin D deficiency Continue supplementation for goal of 60-100 -     VITAMIN D 25 Hydroxy (Vit-D Deficiency, Fractures)  BMI 21 Continue to recommend diet heavy in fruits and veggies and low in animal meats, cheeses, and dairy products, appropriate calorie intake Discuss exercise recommendations routinely Continue to monitor weight at each visit  TMJ Follow up dentist/oral surgeon as recommended, recently improved  Insomnia - good sleep hygiene discussed, increase day time activity, try benadryl if this does not help we will call in sleep medication. *** - Discussed trazodone as possible option.  - TS  Proteinuria ***  No orders of the defined types were placed in this encounter.   Discussed med's effects and SE's. Screening labs and tests as requested with regular follow-up as recommended. Over 40 minutes of exam, counseling, chart review, and complex, high level critical decision making was performed this visit.   Future Appointments  Date Time Provider [place]  10/06/2021 10:00 AM Liane Comber, NP [facility] None  10/09/2022 10:00 AM Alycia Rossetti, NP [facility] None    HPI  46 y.o. female  presents for a complete physical and follow up for has History of PCOS; History of IBS; Mixed hyperlipidemia; Vitamin D deficiency; TMJ disease; History of mitral valve prolapse; Insomnia; and Proteinuria on their problem list.   Married, works as Government social research officer, 3 kids 73, 51, 72.   Has history of PCOS, goes to see GYN annually, saw on  07/28/2020. Getting annual mammograms. Have menses. Husband with vasectomy.   Has dx of IBS D/C, saw GI in 20s, controlled by lifestyle modification and uses hyoscyamine PRN, flares with stress or when out of routine.   She will wake up with headaches, has seen her dentist, TMJ. She states sx have improved, mild.  She was having insomnia, prescribed trazodone ***  BMI is There is no height or weight on file to calculate BMI., she has not been working on diet and exercise.  Has been cooking at home, loves fruit.  Drinks only water, very rarely tea or juice. Minimal 3+ bottles daily  Sleeps 5-6 hours every evening since having trouble falling back asleep.  Wt Readings from Last 3 Encounters:  04/11/21 132 lb (59.9 kg)  02/17/21 136 lb (61.7 kg)  10/06/20 132 lb 9.6 oz (60.1 kg)   Today their BP is    She does workout. She denies chest pain, shortness of breath, dizziness.  She is not on cholesterol medication and denies myalgias. MGM had CVAs late in life. Her cholesterol is not at goal. The cholesterol last visit was:   Lab Results  Component Value Date   CHOL 180 04/11/2021   HDL 49 (L) 04/11/2021   LDLCALC 115 (H) 04/11/2021   TRIG 68 04/11/2021   CHOLHDL 3.7 04/11/2021    Last A1C in the office was:  Lab Results  Component Value Date   HGBA1C 5.2 10/06/2018   She did have 2 x protein positive UA in 2022, was referred to Dr. Hollie Salk, bengin workup there, was pending 24 hour urine  protein *** Felt likely false + due to concentrated specimen Last GFR: Lab Results  Component Value Date   EGFR 101 04/11/2021   Lab Results  Component Value Date   MICRALBCREAT 4 04/11/2021   Patient is on cholecalciferol 4000 IU but admits hasn't been taking.  Lab Results  Component Value Date   VD25OH 26 (L) 10/07/2019       Current Medications:  Current Outpatient Medications on File Prior to Visit  Medication Sig Dispense Refill   Cholecalciferol (VITAMIN D) 2000 units tablet Take  4,000 Units by mouth daily.  (Patient not taking: Reported on 10/06/2020)     hyoscyamine (LEVSIN SL) 0.125 MG SL tablet Place 1 tablet (0.125 mg total) under the tongue every 4 (four) hours as needed. 60 tablet 2   IBUPROFEN PO Take by mouth as needed.     Loratadine (CLARITIN PO) Take by mouth as needed.     traZODone (DESYREL) 50 MG tablet 1/2-1 TABLET FOR SLEEP 90 tablet 1   No current facility-administered medications on file prior to visit.   Allergies:  Allergies  Allergen Reactions   Allegra [Fexofenadine Hcl]    Medical History:  She has History of PCOS; History of IBS; Mixed hyperlipidemia; Vitamin D deficiency; TMJ disease; History of mitral valve prolapse; Insomnia; and Proteinuria on their problem list.   Health Maintenance:   Immunization History  Administered Date(s) Administered   Influenza-Unspecified 02/13/2019, 03/14/2021   PFIZER(Purple Top)SARS-COV-2 Vaccination 05/01/2019, 05/30/2019   PPD Test 08/05/2015   Td 10/06/2001   Tdap 01/15/2012   Health Maintenance  Topic Date Due   COVID-19 Vaccine (6 - Booster for Pfizer series) 05/02/2020   COLONOSCOPY (Pts 45-71yr Insurance coverage will need to be confirmed)  Never done   PAP SMEAR-Modifier  08/25/2021   INFLUENZA VACCINE  11/14/2021   TETANUS/TDAP  01/14/2022   Hepatitis C Screening  Completed   HIV Screening  Completed   HPV VACCINES  Aged Out   Pap: never abnormal, follows GYN, had PAP 08/26/2018 MGM: 05/23/2020, has annually by GYN, dense DEXA: n/a  Colonoscopy: DUE ***  Tetanus: 01/2012 ***  Dental: Dr. CLoma Newton 2021, goes q64mision: Dr. GoDelman Cheadle2022, glasses  Patient Care Team: McUnk PintoMD as PCP - General (Internal Medicine) GrDian QueenMD as Consulting Physician (Obstetrics and Gynecology) GoJari PiggMD as Consulting Physician (Dermatology) GoMelissa NoonODSussexs Referring Physician (Optometry)  Surgical History:  She has a past surgical history that includes Wisdom tooth  extraction. Family History:  Herfamily history includes Arthritis in her brother; Asthma in her son; Cancer in her father, maternal grandfather, and paternal grandmother; Diabetes in her mother; Endometrial cancer (age of onset: 6526in her maternal aunt; Hyperlipidemia in her mother; Leukemia in her father; Stroke in her maternal grandfather and maternal grandmother. Social History:  She reports that she has never smoked. She has never used smokeless tobacco. She reports that she does not drink alcohol and does not use drugs.  Review of Systems: Review of Systems  Constitutional:  Negative for malaise/fatigue and weight loss.  HENT:  Negative for hearing loss and tinnitus.   Eyes:  Negative for blurred vision and double vision.  Respiratory:  Negative for cough, sputum production, shortness of breath and wheezing.   Cardiovascular:  Negative for chest pain, palpitations, orthopnea, claudication, leg swelling and PND.  Gastrointestinal:  Positive for constipation and diarrhea. Negative for abdominal pain, blood in stool, heartburn, melena, nausea and vomiting.  Intermittent occasional diarrhea/constipation associated with stress (IBS)  Genitourinary: Negative.   Musculoskeletal: Negative.  Negative for falls, joint pain and myalgias.  Skin:  Negative for rash.  Neurological:  Negative for dizziness, tingling, sensory change, weakness and headaches.  Endo/Heme/Allergies:  Negative for polydipsia.  Psychiatric/Behavioral:  Negative for depression, memory loss, substance abuse and suicidal ideas. The patient has insomnia (difficulty falling back asleep). The patient is not nervous/anxious.   All other systems reviewed and are negative.   Physical Exam: Estimated body mass index is 21.63 kg/m as calculated from the following:   Height as of 10/06/20: 5' 5.5" (1.664 m).   Weight as of 04/11/21: 132 lb (59.9 kg). There were no vitals taken for this visit. General Appearance: Well  nourished, in no apparent distress.  Eyes: PERRLA, EOMs, conjunctiva no swelling or erythema Sinuses: No Frontal/maxillary tenderness  ENT/Mouth: Ext aud canals clear, normal light reflex with TMs without erythema, bulging. Good dentition. No erythema, swelling, or exudate on post pharynx. Tonsils not swollen or erythematous. Hearing normal. + TMJ, no abscess, good dentition Neck: Supple, thyroid normal. No bruits  Respiratory: Respiratory effort normal, BS equal bilaterally without rales, rhonchi, wheezing or stridor.  Cardio: RRR without murmurs, rubs or gallops. Brisk peripheral pulses without edema.  Chest: symmetric, with normal excursions and percussion.  Breasts: Defer to GYN Abdomen: Soft, nontender at this time, no guarding, rebound, hernias, masses, or organomegaly.  Lymphatics: Non tender without lymphadenopathy.  Genitourinary: Defer to GYN Musculoskeletal: Full ROM all peripheral extremities,5/5 strength, and normal gait.  Skin: Warm, dry without rashes, lesions, ecchymosis. Numerous nevi of benign appearance.  Neuro: Cranial nerves intact, reflexes equal bilaterally. Normal muscle tone, no cerebellar symptoms. Sensation intact.  Psych: Awake and oriented X 3, normal affect, Insight and Judgment appropriate.   EKG: WNL in 2020, no concerns, low risk, defer  Izora Ribas, NP-C 2:33 PM [facility]

## 2021-10-06 ENCOUNTER — Encounter: Payer: Self-pay | Admitting: Adult Health

## 2021-10-06 ENCOUNTER — Ambulatory Visit (INDEPENDENT_AMBULATORY_CARE_PROVIDER_SITE_OTHER): Payer: 59 | Admitting: Adult Health

## 2021-10-06 VITALS — BP 104/76 | HR 66 | Temp 96.8°F | Wt 133.8 lb

## 2021-10-06 DIAGNOSIS — Z8742 Personal history of other diseases of the female genital tract: Secondary | ICD-10-CM

## 2021-10-06 DIAGNOSIS — Z0000 Encounter for general adult medical examination without abnormal findings: Secondary | ICD-10-CM | POA: Diagnosis not present

## 2021-10-06 DIAGNOSIS — E559 Vitamin D deficiency, unspecified: Secondary | ICD-10-CM

## 2021-10-06 DIAGNOSIS — Z8719 Personal history of other diseases of the digestive system: Secondary | ICD-10-CM

## 2021-10-06 DIAGNOSIS — E782 Mixed hyperlipidemia: Secondary | ICD-10-CM

## 2021-10-06 DIAGNOSIS — K589 Irritable bowel syndrome without diarrhea: Secondary | ICD-10-CM

## 2021-10-06 DIAGNOSIS — R809 Proteinuria, unspecified: Secondary | ICD-10-CM

## 2021-10-06 DIAGNOSIS — G4700 Insomnia, unspecified: Secondary | ICD-10-CM

## 2021-10-06 DIAGNOSIS — Z1211 Encounter for screening for malignant neoplasm of colon: Secondary | ICD-10-CM

## 2021-10-06 DIAGNOSIS — Z8679 Personal history of other diseases of the circulatory system: Secondary | ICD-10-CM

## 2021-10-06 DIAGNOSIS — Z6821 Body mass index (BMI) 21.0-21.9, adult: Secondary | ICD-10-CM

## 2021-10-06 DIAGNOSIS — Z0001 Encounter for general adult medical examination with abnormal findings: Secondary | ICD-10-CM

## 2021-10-06 DIAGNOSIS — Z79899 Other long term (current) drug therapy: Secondary | ICD-10-CM

## 2021-10-06 DIAGNOSIS — Z13228 Encounter for screening for other metabolic disorders: Secondary | ICD-10-CM

## 2021-10-06 MED ORDER — HYOSCYAMINE SULFATE 0.125 MG SL SUBL
0.1250 mg | SUBLINGUAL_TABLET | SUBLINGUAL | 2 refills | Status: DC | PRN
Start: 2021-10-06 — End: 2022-10-10

## 2021-10-07 LAB — COMPLETE METABOLIC PANEL WITH GFR
AG Ratio: 1.8 (calc) (ref 1.0–2.5)
ALT: 17 U/L (ref 6–29)
AST: 18 U/L (ref 10–35)
Albumin: 4.4 g/dL (ref 3.6–5.1)
Alkaline phosphatase (APISO): 51 U/L (ref 31–125)
BUN: 11 mg/dL (ref 7–25)
CO2: 26 mmol/L (ref 20–32)
Calcium: 9.1 mg/dL (ref 8.6–10.2)
Chloride: 107 mmol/L (ref 98–110)
Creat: 0.82 mg/dL (ref 0.50–0.99)
Globulin: 2.4 g/dL (calc) (ref 1.9–3.7)
Glucose, Bld: 79 mg/dL (ref 65–99)
Potassium: 4 mmol/L (ref 3.5–5.3)
Sodium: 139 mmol/L (ref 135–146)
Total Bilirubin: 0.4 mg/dL (ref 0.2–1.2)
Total Protein: 6.8 g/dL (ref 6.1–8.1)
eGFR: 90 mL/min/{1.73_m2} (ref >=60)

## 2021-10-07 LAB — MICROALBUMIN / CREATININE URINE RATIO
Creatinine, Urine: 222 mg/dL (ref 20–275)
Microalb Creat Ratio: 3 mcg/mg creat (ref <30)
Microalb, Ur: 0.7 mg/dL

## 2021-10-07 LAB — URINALYSIS, ROUTINE W REFLEX MICROSCOPIC
Bilirubin Urine: NEGATIVE
Glucose, UA: NEGATIVE
Hgb urine dipstick: NEGATIVE
Ketones, ur: NEGATIVE
Leukocytes,Ua: NEGATIVE
Nitrite: NEGATIVE
Protein, ur: NEGATIVE
Specific Gravity, Urine: 1.022 (ref 1.001–1.035)
pH: 6 (ref 5.0–8.0)

## 2021-10-07 LAB — MAGNESIUM: Magnesium: 2.1 mg/dL (ref 1.5–2.5)

## 2021-10-07 LAB — CBC WITH DIFFERENTIAL/PLATELET
Absolute Monocytes: 278 cells/uL (ref 200–950)
Basophils Absolute: 19 cells/uL (ref 0–200)
Basophils Relative: 0.5 %
Eosinophils Absolute: 19 cells/uL (ref 15–500)
Eosinophils Relative: 0.5 %
HCT: 40.8 % (ref 35.0–45.0)
Hemoglobin: 13.8 g/dL (ref 11.7–15.5)
Lymphs Abs: 1066 cells/uL (ref 850–3900)
MCH: 30.1 pg (ref 27.0–33.0)
MCHC: 33.8 g/dL (ref 32.0–36.0)
MCV: 88.9 fL (ref 80.0–100.0)
MPV: 13 fL — ABNORMAL HIGH (ref 7.5–12.5)
Monocytes Relative: 7.5 %
Neutro Abs: 2320 cells/uL (ref 1500–7800)
Neutrophils Relative %: 62.7 %
Platelets: 158 10*3/uL (ref 140–400)
RBC: 4.59 10*6/uL (ref 3.80–5.10)
RDW: 12.8 % (ref 11.0–15.0)
Total Lymphocyte: 28.8 %
WBC: 3.7 10*3/uL — ABNORMAL LOW (ref 3.8–10.8)

## 2021-10-07 LAB — TSH: TSH: 1.32 mIU/L

## 2021-10-07 LAB — LIPID PANEL
Cholesterol: 187 mg/dL (ref <200)
HDL: 50 mg/dL (ref >=50)
LDL Cholesterol (Calc): 119 mg/dL (calc) — ABNORMAL HIGH
Non-HDL Cholesterol (Calc): 137 mg/dL (calc) — ABNORMAL HIGH (ref <130)
Total CHOL/HDL Ratio: 3.7 (calc) (ref <5.0)
Triglycerides: 82 mg/dL (ref <150)

## 2021-10-07 LAB — VITAMIN D 25 HYDROXY (VIT D DEFICIENCY, FRACTURES): Vit D, 25-Hydroxy: 28 ng/mL — ABNORMAL LOW (ref 30–100)

## 2021-11-15 LAB — COLOGUARD: COLOGUARD: NEGATIVE

## 2021-12-25 ENCOUNTER — Encounter: Payer: Self-pay | Admitting: Nurse Practitioner

## 2021-12-25 ENCOUNTER — Ambulatory Visit: Payer: 59 | Admitting: Nurse Practitioner

## 2021-12-25 VITALS — HR 95 | Temp 100.2°F

## 2021-12-25 DIAGNOSIS — U071 COVID-19: Secondary | ICD-10-CM | POA: Diagnosis not present

## 2021-12-25 DIAGNOSIS — J020 Streptococcal pharyngitis: Secondary | ICD-10-CM | POA: Diagnosis not present

## 2021-12-25 MED ORDER — AZITHROMYCIN 250 MG PO TABS: ORAL_TABLET | ORAL | 1 refills | Status: DC

## 2021-12-25 MED ORDER — DEXAMETHASONE 1 MG PO TABS: ORAL_TABLET | ORAL | 0 refills | Status: DC

## 2021-12-25 NOTE — Patient Instructions (Addendum)
Take tylenol PRN temp 101+ Push hydration Regular ambulation or calf exercises exercises for clot prevention and 81 mg ASA unless contraindicated Sx supportive therapy suggested Follow up via mychart or telephone if needed Advised patient obtain O2 monitor; present to ED if persistently <90% or with severe dyspnea, CP, fever uncontrolled by tylenol, confusion, sudden decline Should remain in isolation until at least 5 days from onset of sx, 24-48 hours fever free without tylenol, sx such as cough are improved.   Strep Throat, Adult Strep throat is an infection in the throat that is caused by bacteria. It is common during the cold months of the year. It mostly affects children who are 69-53 years old. However, people of all ages can get it at any time of the year. This infection spreads from person to person (is contagious) through coughing, sneezing, or having close contact. Your health care provider may use other names to describe the infection. When strep throat affects the tonsils, it is called tonsillitis. When it affects the back of the throat, it is called pharyngitis. What are the causes? This condition is caused by the Streptococcus pyogenes bacteria. What increases the risk? You are more likely to develop this condition if: You care for school-age children, or are around school-age children. Children are more likely to get strep throat and may spread it to others. You spend time in crowded places where the infection can spread easily. You have close contact with someone who has strep throat. What are the signs or symptoms? Symptoms of this condition include: Fever or chills. Redness, swelling, or pain in the tonsils or throat. Pain or difficulty when swallowing. White or yellow spots on the tonsils or throat. Tender glands in the neck and under the jaw. Bad smelling breath. Red rash all over the body. This is rare. How is this diagnosed? This condition is diagnosed by tests that  check for the presence and the amount of bacteria that cause strep throat. They are: Rapid strep test. Your throat is swabbed and checked for the presence of bacteria. Results are usually ready in minutes. Throat culture test. Your throat is swabbed. The sample is placed in a cup that allows infections to grow. Results are usually ready in 1 or 2 days. How is this treated? This condition may be treated with: Medicines that kill germs (antibiotics). Medicines that relieve pain or fever. These include: Ibuprofen or acetaminophen. Aspirin, only for people who are over the age of 19. Throat lozenges. Throat sprays. Follow these instructions at home: Medicines  Take over-the-counter and prescription medicines only as told by your health care provider. Take your antibiotic medicine as told by your health care provider. Do not stop taking the antibiotic even if you start to feel better. Eating and drinking  If you have trouble swallowing, try eating soft foods until your sore throat feels better. Drink enough fluid to keep your urine pale yellow. To help relieve pain, you may have: Warm fluids, such as soup and tea. Cold fluids, such as frozen desserts or popsicles. General instructions Gargle with a salt-water mixture 3-4 times a day or as needed. To make a salt-water mixture, completely dissolve -1 tsp (3-6 g) of salt in 1 cup (237 mL) of warm water. Get plenty of rest. Stay home from work or school until you have been taking antibiotics for 24 hours. Do not use any products that contain nicotine or tobacco. These products include cigarettes, chewing tobacco, and vaping devices, such as e-cigarettes. If  you need help quitting, ask your health care provider. It is up to you to get your test results. Ask your health care provider, or the department that is doing the test, when your results will be ready. Keep all follow-up visits. This is important. How is this prevented?  Do not share  food, drinking cups, or personal items that could cause the infection to spread to other people. Wash your hands often with soap and water for at least 20 seconds. If soap and water are not available, use hand sanitizer. Make sure that all people in your house wash their hands well. Have family members tested if they have a sore throat or fever. They may need an antibiotic if they have strep throat. Contact a health care provider if: You have swelling in your neck that keeps getting bigger. You develop a rash, cough, or earache. You cough up a thick mucus that is green, yellow-brown, or bloody. You have pain or discomfort that does not get better with medicine. Your symptoms seem to be getting worse. You have a fever. Get help right away if: You have new symptoms, such as vomiting, severe headache, stiff or painful neck, chest pain, or shortness of breath. You have severe throat pain, drooling, or changes in your voice. You have swelling of the neck, or the skin on the neck becomes red and tender. You have signs of dehydration, such as tiredness (fatigue), dry mouth, and decreased urination. You become increasingly sleepy, or you cannot wake up completely. Your joints become red or painful. These symptoms may represent a serious problem that is an emergency. Do not wait to see if the symptoms will go away. Get medical help right away. Call your local emergency services (911 in the [place] Do not drive yourself to the hospital. Summary Strep throat is an infection in the throat that is caused by the Streptococcus pyogenes bacteria. This infection is spread from person to person (is contagious) through coughing, sneezing, or having close contact. Take your medicines, including antibiotics, as told by your health care provider. Do not stop taking the antibiotic even if you start to feel better. To prevent the spread of germs, wash your hands well with soap and water. Have others do the same. Do not  share food, drinking cups, or personal items. Get help right away if you have new symptoms, such as vomiting, severe headache, stiff or painful neck, chest pain, or shortness of breath. This information is not intended to replace advice given to you by your health care provider. Make sure you discuss any questions you have with your health care provider. Document Revised: 07/26/2020 Document Reviewed: 07/26/2020 Elsevier Patient Education  2023 [employer].

## 2021-12-25 NOTE — Progress Notes (Signed)
THIS ENCOUNTER IS A VIRTUAL VISIT DUE TO COVID-19 - PATIENT WAS NOT SEEN IN THE OFFICE.  PATIENT HAS CONSENTED TO VIRTUAL VISIT / TELEMEDICINE VISIT   Virtual Visit via telephone Note  I connected with  Alyssa Young on 12/25/2021 by telephone.  I verified that I am speaking with the correct person using two identifiers.    I discussed the limitations of evaluation and management by telemedicine and the availability of in person appointments. The patient expressed understanding and agreed to proceed.  History of Present Illness:  Pulse 95   Temp 100.2 F (37.9 C)   SpO2 99%  46 y.o. patient contacted office reporting URI sx . she tested positive by 12/21/21. OV was conducted by telephone to minimize exposure. This patient was vaccinated for covid 19, last 05/30/19 Her son was positive for strep throat 12/18/21 and has noted in the past 3 days her sore throat has become severe and worsening of body aches  Immunization History  Administered Date(s) Administered   Influenza-Unspecified 02/13/2019, 03/14/2021   PFIZER(Purple Top)SARS-COV-2 Vaccination 05/01/2019, 05/30/2019   PPD Test 08/05/2015   Td 10/06/2001   Tdap 01/15/2012    Sx began 4 days ago with sore throat, headaches, body aches, fever, congestion, nonproductive cough.  Treatments tried so far: Iburpofen and Aleve  Exposures: son    Medications    Current Outpatient Medications (Respiratory):    Loratadine (CLARITIN PO), Take by mouth as needed.  Current Outpatient Medications (Analgesics):    IBUPROFEN PO, Take by mouth as needed.   Current Outpatient Medications (Other):    Cholecalciferol (VITAMIN D) 2000 units tablet, Take 4,000 Units by mouth daily.   hyoscyamine (LEVSIN SL) 0.125 MG SL tablet, Place 1 tablet (0.125 mg total) under the tongue every 4 (four) hours as needed.   traZODone (DESYREL) 50 MG tablet, 1/2-1 TABLET FOR SLEEP  Allergies:  Allergies  Allergen Reactions   Allegra [Fexofenadine  Hcl]     Problem list She has History of PCOS; IBS (irritable bowel syndrome); Mixed hyperlipidemia; Vitamin D deficiency; TMJ disease; History of mitral valve prolapse; Insomnia; and Proteinuria on their problem list.   Social History:   reports that she has never smoked. She has never used smokeless tobacco. She reports that she does not drink alcohol and does not use drugs.  Observations/Objective:  General : Well sounding patient in no apparent distress HEENT: no hoarseness, no cough for duration of visit Lungs: speaks in complete sentences, no audible wheezing, no apparent distress Neurological: alert, oriented x 3 Psychiatric: pleasant, judgement appropriate   Assessment and Plan:  Covid 19 Covid 19 positive per rapid screening test in home 12/21/09 Risk factors include: none Symptoms are: mild Immue support reviewed  Take tylenol PRN temp 101+ Push hydration Regular ambulation or calf exercises exercises for clot prevention and 81 mg ASA unless contraindicated Sx supportive therapy suggested Follow up via mychart or telephone if needed Advised patient obtain O2 monitor; present to ED if persistently <90% or with severe dyspnea, CP, fever uncontrolled by tylenol, confusion, sudden decline Should remain in isolation until at least 5 days from onset of sx, 24-48 hours fever free without tylenol, sx such as cough are improved.   Diagnoses and all orders for this visit:  COVID -     dexamethasone (DECADRON) 1 MG tablet; Take 3 tabs for 3 days, 2 tabs for 3 days 1 tab for 5 days. Take with food. -     azithromycin (ZITHROMAX) 250 MG tablet;  Take 2 tablets (500 mg) on  Day 1,  followed by 1 tablet (250 mg) once daily on Days 2 through 5.  Strep pharyngitis -     dexamethasone (DECADRON) 1 MG tablet; Take 3 tabs for 3 days, 2 tabs for 3 days 1 tab for 5 days. Take with food. -     azithromycin (ZITHROMAX) 250 MG tablet; Take 2 tablets (500 mg) on  Day 1,  followed by 1 tablet  (250 mg) once daily on Days 2 through 5.      Follow Up Instructions:  I discussed the assessment and treatment plan with the patient. The patient was provided an opportunity to ask questions and all were answered. The patient agreed with the plan and demonstrated an understanding of the instructions.   The patient was advised to call back or seek an in-person evaluation if the symptoms worsen or if the condition fails to improve as anticipated.  I provided 20 minutes of non-face-to-face time during this encounter.   Raynelle Dick, NP

## 2022-10-08 NOTE — Progress Notes (Unsigned)
Complete Physical  Assessment and Plan:  Encounter for Annual Physical Exam with abnormal findings Due annually  Health Maintenance reviewed Healthy lifestyle reviewed and goals set  History of PCOS Weights managed Followed by GYN  IBS Controlled by diet and hyoscyamine PRN  Mixed hyperlipidemia Currently managed by lifestyle Continue low cholesterol diet and exercise.  -     TSH -     Lipid panel  Vitamin D deficiency Recommend supplementation for goal of 60-100 -     VITAMIN D 25 Hydroxy (Vit-D Deficiency, Fractures)  BMI 21 Continue to recommend diet heavy in fruits and veggies and low in animal meats, cheeses, and dairy products, appropriate calorie intake Discuss exercise recommendations routinely Continue to monitor weight at each visit  TMJ Follow up dentist/oral surgeon as recommended, recently improved  Insomnia - good sleep hygiene discussed, increase day time activity - Trazodone dose help - TS  Proteinuria - has seen Dr. Signe Young, likely false positive, pending 24 hour urine  No orders of the defined types were placed in this encounter.   Discussed med's effects and SE's. Screening labs and tests as requested with regular follow-up as recommended. Over 40 minutes of exam, counseling, chart review, and complex, high level critical decision making was performed this visit.   Future Appointments  Date Time Provider Department Center  10/09/2022 10:00 AM Alyssa Dick, NP [facility] None  10/09/2023 10:00 AM Alyssa Dick, NP [facility] None    HPI  47 y.o. female  presents for a complete physical and follow up for has History of PCOS; IBS (irritable bowel syndrome); Mixed hyperlipidemia; Vitamin D deficiency; TMJ disease; History of mitral valve prolapse; Insomnia; and Proteinuria on their problem list.   Married, works as Tax adviser, 3 kids 8, 15, 64 - eldest just graduated, plans.   Has history of PCOS, goes to see GYN annually, reports has  upcoming Aug 2023. Getting annual mammograms there. Have menses. Husband with vasectomy.   Has dx of IBS D/C, saw GI in 20s, controlled by lifestyle modification and uses hyoscyamine PRN, flares with stress or when out of routine.   She will wake up with headaches, has seen her dentist, TMJ.  She states sx have improved, mild.  She was having insomnia, prescribed trazodone, 25 mg worked well if needed.   BMI is There is no height or weight on file to calculate BMI., she has not been working on diet and exercise.  Has been cooking at home when able (eating out more when kids sport schedules, trying to do better), loves fruit.  Drinks only water, very rarely tea or juice. Minimal 3+ bottles daily  Wt Readings from Last 3 Encounters:  10/06/21 133 lb 12.8 oz (60.7 kg)  04/11/21 132 lb (59.9 kg)  02/17/21 136 lb (61.7 kg)   Today their BP is    She does workout. She denies chest pain, shortness of breath, dizziness.  She is not on cholesterol medication and denies myalgias. MGM had CVAs late in life. Her cholesterol is not at goal. The cholesterol last visit was:   Lab Results  Component Value Date   CHOL 187 10/06/2021   HDL 50 10/06/2021   LDLCALC 119 (H) 10/06/2021   TRIG 82 10/06/2021   CHOLHDL 3.7 10/06/2021    Last A1C in the office was:  Lab Results  Component Value Date   HGBA1C 5.2 10/06/2018   She did have 2 x protein positive UA in 2022, was referred to Dr. Signe Young,  bengin workup there, pending 24 hour urine protein, patient reports planning next week Felt likely false + due to concentrated specimen Last GFR: Lab Results  Component Value Date   EGFR 90 10/06/2021   Lab Results  Component Value Date   MICRALBCREAT 3 10/06/2021   Patient is on cholecalciferol 4000 IU but admits hasn't been taking.  Lab Results  Component Value Date   VD25OH 28 (L) 10/06/2021       Current Medications:  Current Outpatient Medications on File Prior to Visit  Medication Sig  Dispense Refill   azithromycin (ZITHROMAX) 250 MG tablet Take 2 tablets (500 mg) on  Day 1,  followed by 1 tablet (250 mg) once daily on Days 2 through 5. 6 each 1   Cholecalciferol (VITAMIN D) 2000 units tablet Take 4,000 Units by mouth daily. (Patient not taking: Reported on 12/25/2021)     dexamethasone (DECADRON) 1 MG tablet Take 3 tabs for 3 days, 2 tabs for 3 days 1 tab for 5 days. Take with food. 20 tablet 0   hyoscyamine (LEVSIN SL) 0.125 MG SL tablet Place 1 tablet (0.125 mg total) under the tongue every 4 (four) hours as needed. 60 tablet 2   IBUPROFEN PO Take by mouth as needed.     Loratadine (CLARITIN PO) Take by mouth as needed.     naproxen sodium (ALEVE) 220 MG tablet Take 220 mg by mouth.     traZODone (DESYREL) 50 MG tablet 1/2-1 TABLET FOR SLEEP 90 tablet 1   No current facility-administered medications on file prior to visit.   Allergies:  Allergies  Allergen Reactions   Allegra [Fexofenadine Hcl]    Medical History:  She has History of PCOS; IBS (irritable bowel syndrome); Mixed hyperlipidemia; Vitamin D deficiency; TMJ disease; History of mitral valve prolapse; Insomnia; and Proteinuria on their problem list.   Health Maintenance:   Immunization History  Administered Date(s) Administered   Hep B, Unspecified 11/24/1994   Influenza,inj,Quad PF,6+ Mos 02/11/2017   Influenza-Unspecified 02/13/2019, 03/07/2020, 03/14/2021, 03/14/2022   MMR 06/23/1980   PFIZER(Purple Top)SARS-COV-2 Vaccination 05/01/2019, 05/30/2019   PPD Test 08/05/2015   Td 10/06/2001   Tdap 01/15/2012   Health Maintenance  Topic Date Due   COVID-19 Vaccine (3 - Pfizer risk series) 06/27/2019   PAP SMEAR-Modifier  08/25/2021   DTaP/Tdap/Td (3 - Td or Tdap) 01/14/2022   INFLUENZA VACCINE  11/15/2022   Fecal DNA (Cologuard)  11/07/2024   Hepatitis C Screening  Completed   HIV Screening  Completed   HPV VACCINES  Aged Out   Pap: never abnormal, follows GYN, had PAP 08/26/2018, due this year,  has scheduled 98/2023, report requested MGM: 05/23/2020, has annually by GYN, dense DEXA: n/a  Colonoscopy: DUE - declines, receptive to cologuard  Tetanus: 01/2012 - reports wants to wait, will get at pharmacy   Dental: Dr. Genia Young, 2023, goes q64m Vision: Dr. Emily Filbert, 2022, glasses, plans to get new provider this year  Patient Care Team: Lucky Cowboy, MD as PCP - General (Internal Medicine) Marcelle Overlie, MD as Consulting Physician (Obstetrics and Gynecology) Manning Charity, OD as Referring Physician (Optometry)  Surgical History:  She has a past surgical history that includes Wisdom tooth extraction. Family History:  Herfamily history includes Arthritis in her brother; Asthma in her son; Cancer in her father and paternal grandmother; Cancer - Prostate in her maternal grandfather; Diabetes in her mother; Endometrial cancer (age of onset: 66) in her maternal aunt; Hyperlipidemia in her mother; Leukemia in her father;  Stroke in her maternal grandfather and maternal grandmother. Social History:  She reports that she has never smoked. She has never used smokeless tobacco. She reports that she does not drink alcohol and does not use drugs.  Review of Systems: Review of Systems  Constitutional:  Negative for malaise/fatigue and weight loss.  HENT:  Negative for hearing loss and tinnitus.   Eyes:  Negative for blurred vision and double vision.  Respiratory:  Negative for cough, sputum production, shortness of breath and wheezing.   Cardiovascular:  Negative for chest pain, palpitations, orthopnea, claudication, leg swelling and PND.  Gastrointestinal:  Positive for constipation and diarrhea. Negative for abdominal pain, blood in stool, heartburn, melena, nausea and vomiting.       Intermittent occasional diarrhea/constipation associated with stress (IBS)  Genitourinary: Negative.   Musculoskeletal: Negative.  Negative for falls, joint pain and myalgias.  Skin:  Negative for rash.   Neurological:  Negative for dizziness, tingling, sensory change, weakness and headaches.  Endo/Heme/Allergies:  Negative for polydipsia.  Psychiatric/Behavioral:  Negative for depression, memory loss, substance abuse and suicidal ideas. The patient has insomnia (difficulty falling back asleep). The patient is not nervous/anxious.   All other systems reviewed and are negative.   Physical Exam: Estimated body mass index is 21.93 kg/m as calculated from the following:   Height as of 10/06/20: 5' 5.5" (1.664 m).   Weight as of 10/06/21: 133 lb 12.8 oz (60.7 kg). There were no vitals taken for this visit. General Appearance: Well nourished, in no apparent distress.  Eyes: PERRLA, EOMs, conjunctiva no swelling or erythema Sinuses: No Frontal/maxillary tenderness  ENT/Mouth: Ext aud canals clear, normal light reflex with TMs without erythema, bulging. Good dentition. No erythema, swelling, or exudate on post pharynx. Tonsils not swollen or erythematous. Hearing normal. + TMJ, no abscess, good dentition Neck: Supple, thyroid normal. No bruits  Respiratory: Respiratory effort normal, BS equal bilaterally without rales, rhonchi, wheezing or stridor.  Cardio: RRR without murmurs, rubs or gallops. Brisk peripheral pulses without edema.  Chest: symmetric, with normal excursions and percussion.  Breasts: Defer to GYN Abdomen: Soft, nontender at this time, no guarding, rebound, hernias, masses, or organomegaly.  Lymphatics: Non tender without lymphadenopathy.  Genitourinary: Defer to GYN Musculoskeletal: Full ROM all peripheral extremities,5/5 strength, and normal gait.  Skin: Warm, dry without rashes, lesions, ecchymosis. Numerous nevi of benign appearance.  Neuro: Cranial nerves intact, reflexes equal bilaterally. Normal muscle tone, no cerebellar symptoms. Sensation intact.  Psych: Awake and oriented X 3, normal affect, Insight and Judgment appropriate.   EKG: WNL in 2020, no concerns, low risk,  defer  Alyssa Young Hollie Salk, NP-C 1:37 PM [facility]

## 2022-10-09 ENCOUNTER — Ambulatory Visit (INDEPENDENT_AMBULATORY_CARE_PROVIDER_SITE_OTHER): Payer: 59 | Admitting: Nurse Practitioner

## 2022-10-09 ENCOUNTER — Encounter: Payer: Self-pay | Admitting: Nurse Practitioner

## 2022-10-09 VITALS — BP 108/58 | HR 77 | Temp 97.7°F | Ht 65.5 in | Wt 145.2 lb

## 2022-10-09 DIAGNOSIS — Z6821 Body mass index (BMI) 21.0-21.9, adult: Secondary | ICD-10-CM

## 2022-10-09 DIAGNOSIS — Z0001 Encounter for general adult medical examination with abnormal findings: Secondary | ICD-10-CM

## 2022-10-09 DIAGNOSIS — M26609 Unspecified temporomandibular joint disorder, unspecified side: Secondary | ICD-10-CM

## 2022-10-09 DIAGNOSIS — Z136 Encounter for screening for cardiovascular disorders: Secondary | ICD-10-CM

## 2022-10-09 DIAGNOSIS — Z0000 Encounter for general adult medical examination without abnormal findings: Secondary | ICD-10-CM | POA: Diagnosis not present

## 2022-10-09 DIAGNOSIS — Z8742 Personal history of other diseases of the female genital tract: Secondary | ICD-10-CM

## 2022-10-09 DIAGNOSIS — E782 Mixed hyperlipidemia: Secondary | ICD-10-CM

## 2022-10-09 DIAGNOSIS — E559 Vitamin D deficiency, unspecified: Secondary | ICD-10-CM

## 2022-10-09 DIAGNOSIS — Z1389 Encounter for screening for other disorder: Secondary | ICD-10-CM

## 2022-10-09 DIAGNOSIS — L819 Disorder of pigmentation, unspecified: Secondary | ICD-10-CM

## 2022-10-09 DIAGNOSIS — R1013 Epigastric pain: Secondary | ICD-10-CM

## 2022-10-09 DIAGNOSIS — R809 Proteinuria, unspecified: Secondary | ICD-10-CM

## 2022-10-09 DIAGNOSIS — K589 Irritable bowel syndrome without diarrhea: Secondary | ICD-10-CM

## 2022-10-09 DIAGNOSIS — G4700 Insomnia, unspecified: Secondary | ICD-10-CM

## 2022-10-09 DIAGNOSIS — Z1329 Encounter for screening for other suspected endocrine disorder: Secondary | ICD-10-CM

## 2022-10-09 DIAGNOSIS — Z79899 Other long term (current) drug therapy: Secondary | ICD-10-CM

## 2022-10-09 MED ORDER — FAMOTIDINE 20 MG PO TABS
ORAL_TABLET | ORAL | 1 refills | Status: DC
Start: 2022-10-09 — End: 2022-10-31

## 2022-10-09 NOTE — Patient Instructions (Signed)

## 2022-10-10 ENCOUNTER — Encounter: Payer: Self-pay | Admitting: Nurse Practitioner

## 2022-10-10 ENCOUNTER — Other Ambulatory Visit: Payer: Self-pay | Admitting: Nurse Practitioner

## 2022-10-10 DIAGNOSIS — K589 Irritable bowel syndrome without diarrhea: Secondary | ICD-10-CM

## 2022-10-10 LAB — LIPID PANEL
Cholesterol: 230 mg/dL — ABNORMAL HIGH (ref <200)
LDL Cholesterol (Calc): 165 mg/dL (calc) — ABNORMAL HIGH
Non-HDL Cholesterol (Calc): 183 mg/dL (calc) — ABNORMAL HIGH (ref <130)
Triglycerides: 75 mg/dL (ref <150)

## 2022-10-10 LAB — URINALYSIS, ROUTINE W REFLEX MICROSCOPIC
Bilirubin Urine: NEGATIVE
Glucose, UA: NEGATIVE
Nitrite: NEGATIVE
RBC / HPF: NONE SEEN /HPF (ref 0–2)
Specific Gravity, Urine: 1.027 (ref 1.001–1.035)
WBC, UA: NONE SEEN /HPF (ref 0–5)

## 2022-10-10 LAB — COMPLETE METABOLIC PANEL WITH GFR
AG Ratio: 2 (calc) (ref 1.0–2.5)
AST: 15 U/L (ref 10–35)
Alkaline phosphatase (APISO): 61 U/L (ref 31–125)
CO2: 26 mmol/L (ref 20–32)
Chloride: 108 mmol/L (ref 98–110)
Globulin: 2.2 g/dL (calc) (ref 1.9–3.7)
Glucose, Bld: 86 mg/dL (ref 65–99)
Sodium: 141 mmol/L (ref 135–146)
Total Bilirubin: 0.4 mg/dL (ref 0.2–1.2)
Total Protein: 6.7 g/dL (ref 6.1–8.1)
eGFR: 88 mL/min/{1.73_m2} (ref >=60)

## 2022-10-10 LAB — MAGNESIUM: Magnesium: 2 mg/dL (ref 1.5–2.5)

## 2022-10-10 LAB — VITAMIN D 25 HYDROXY (VIT D DEFICIENCY, FRACTURES): Vit D, 25-Hydroxy: 20 ng/mL — ABNORMAL LOW (ref 30–100)

## 2022-10-10 MED ORDER — HYOSCYAMINE SULFATE 0.125 MG SL SUBL
0.13 mg | SUBLINGUAL_TABLET | SUBLINGUAL | 2 refills | Status: DC | PRN
Start: 2022-10-10 — End: 2022-10-22

## 2022-10-17 LAB — URINALYSIS, ROUTINE W REFLEX MICROSCOPIC
Bacteria, UA: NONE SEEN /HPF
Hyaline Cast: NONE SEEN /LPF
Ketones, ur: NEGATIVE
Leukocytes,Ua: NEGATIVE
Protein, ur: NEGATIVE
Squamous Epithelial / HPF: NONE SEEN /HPF (ref <=5)
pH: 5.5 (ref 5.0–8.0)

## 2022-10-17 LAB — MICROSCOPIC MESSAGE

## 2022-10-17 LAB — CBC WITH DIFFERENTIAL/PLATELET
Absolute Monocytes: 332 cells/uL (ref 200–950)
Basophils Absolute: 31 cells/uL (ref 0–200)
Basophils Relative: 0.8 %
Eosinophils Absolute: 31 cells/uL (ref 15–500)
Eosinophils Relative: 0.8 %
HCT: 37.7 % (ref 35.0–45.0)
Hemoglobin: 12.5 g/dL (ref 11.7–15.5)
Lymphs Abs: 1049 cells/uL (ref 850–3900)
MCH: 28.2 pg (ref 27.0–33.0)
MCHC: 33.2 g/dL (ref 32.0–36.0)
MCV: 85.1 fL (ref 80.0–100.0)
MPV: 12.1 fL (ref 7.5–12.5)
Monocytes Relative: 8.5 %
Neutro Abs: 2457 cells/uL (ref 1500–7800)
Neutrophils Relative %: 63 %
Platelets: 189 10*3/uL (ref 140–400)
RBC: 4.43 10*6/uL (ref 3.80–5.10)
RDW: 14.6 % (ref 11.0–15.0)
Total Lymphocyte: 26.9 %
WBC: 3.9 10*3/uL (ref 3.8–10.8)

## 2022-10-17 LAB — LIPID PANEL
HDL: 47 mg/dL — ABNORMAL LOW (ref >=50)
Total CHOL/HDL Ratio: 4.9 (calc) (ref <5.0)

## 2022-10-17 LAB — COMPLETE METABOLIC PANEL WITH GFR
ALT: 15 U/L (ref 6–29)
Albumin: 4.5 g/dL (ref 3.6–5.1)
BUN: 15 mg/dL (ref 7–25)
Calcium: 8.9 mg/dL (ref 8.6–10.2)
Creat: 0.83 mg/dL (ref 0.50–0.99)
Potassium: 4.1 mmol/L (ref 3.5–5.3)

## 2022-10-17 LAB — MICROALBUMIN / CREATININE URINE RATIO
Creatinine, Urine: 215 mg/dL (ref 20–275)
Microalb Creat Ratio: 2 mg/g creat (ref <30)
Microalb, Ur: 0.4 mg/dL

## 2022-10-17 LAB — TSH: TSH: 2.22 mIU/L

## 2022-10-17 LAB — H. PYLORI BREATH TEST: H. pylori Breath Test: NOT DETECTED

## 2022-10-20 ENCOUNTER — Other Ambulatory Visit: Payer: Self-pay | Admitting: Nurse Practitioner

## 2022-10-20 DIAGNOSIS — K589 Irritable bowel syndrome without diarrhea: Secondary | ICD-10-CM

## 2022-10-31 ENCOUNTER — Other Ambulatory Visit: Payer: Self-pay | Admitting: Nurse Practitioner

## 2022-10-31 DIAGNOSIS — R1013 Epigastric pain: Secondary | ICD-10-CM

## 2023-04-15 NOTE — Progress Notes (Signed)
 Assessment and Plan:  Alyssa Young was seen today for follow-up.  Diagnoses and all orders for this visit:  Mixed hyperlipidemia Currently working on lifestyle Reviewed low saturated fat diet, reduce red meat, increase soluble fiber; try more plant based meals, lean poultry, fish Check lipid panel.  -     COMPLETE METABOLIC PANEL WITH GFR -     Lipid panel  Insomnia, unspecified type - good sleep hygiene discussed, increase day time activity Stress management techniques discussed, increase water, good sleep hygiene discussed, increase exercise, and increase veggies.    Vitamin D  deficiency Continue Vit D supplementation to maintain value in therapeutic level of 60-100  - Vitamin D   Irritable bowel syndrome, unspecified type Continue to monitor Symptoms currently controlled  Medication management -     CBC with Differential/Platelet -     COMPLETE METABOLIC PANEL WITH GFR -     Lipid panel -     VITAMIN D  25 Hydroxy (Vit-D Deficiency, Fractures) -     Urinalysis, Routine w reflex microscopic  Hemoglobinuria -     Urinalysis, Routine w reflex microscopic     Further disposition pending results of labs. Discussed med's effects and SE's.   Over 30 minutes of exam, counseling, chart review, and critical decision making was performed.   Future Appointments  Date Time Provider Department Center  10/09/2023 10:00 AM Jude Lonell BRAVO, NP [facility] None    ------------------------------------------------------------------------------------------------------------------   HPI BP 102/62   Pulse 80   Temp (!) 97.5 F (36.4 C)   Ht 5' 5.5 (1.664 m)   Wt 145 lb 6.4 oz (66 kg)   SpO2 98%   BMI 23.83 kg/m  47 y.o.female presents for 6 month follow up on cholesterol, vitamin D  and also for urine recheck (proteinuria on last check).   She has been sleeping better, not using Trazodone  very frequently.  Will only get 6 hours of sleep no matter what time she goes to bed.   She  has been having migraines around her periods, usually relieved with Ibuprofen  or aleve. Just got new glasses so getting used to those and has had some headaches.   BMI is Body mass index is 23.83 kg/m., she has not been working on diet and exercise, does admit to a lot of red meat, deer meat due to son/husband preference.  Wt Readings from Last 3 Encounters:  04/16/23 145 lb 6.4 oz (66 kg)  10/09/22 145 lb 3.2 oz (65.9 kg)  10/06/21 133 lb 12.8 oz (60.7 kg)   Today their BP is BP: 102/62 BP Readings from Last 3 Encounters:  04/16/23 102/62  10/09/22 (!) 108/58  10/06/21 104/76  She denies chest pain, shortness of breath, dizziness.   She is not on cholesterol medication. MGM had CVAs late in life. Her cholesterol is not at goal. Her cholesterol is not at goal.  She has not eaten great over the holidays The cholesterol last visit was:   Lab Results  Component Value Date   CHOL 230 (H) 10/09/2022   HDL 47 (L) 10/09/2022   LDLCALC 165 (H) 10/09/2022   TRIG 75 10/09/2022   CHOLHDL 4.9 10/09/2022   Patient is on Vitamin D  supplement.   Lab Results  Component Value Date   VD25OH 20 (L) 10/09/2022       Past Medical History:  Diagnosis Date   History of IBS    History of PCOS    Mitral prolapse    takes no anrtibiotics with dental work  Mixed hyperlipidemia 07/27/2013     Allergies  Allergen Reactions   Allegra [Fexofenadine Hcl]    Immunization History  Administered Date(s) Administered   Hep B, Unspecified 11/24/1994   Influenza, Quadrivalent, Recombinant, Inj, Pf 02/08/2018   Influenza,inj,Quad PF,6+ Mos 02/11/2017   Influenza-Unspecified 02/13/2019, 03/07/2020, 03/14/2021, 03/14/2022   MMR 06/23/1980   PFIZER(Purple Top)SARS-COV-2 Vaccination 05/01/2019, 05/30/2019   PPD Test 08/05/2015   Td 10/06/2001   Tdap 01/15/2012     Current Outpatient Medications on File Prior to Visit  Medication Sig   Cholecalciferol (VITAMIN D ) 2000 units tablet Take 4,000 Units by  mouth daily.   famotidine  (PEPCID ) 20 MG tablet TAKE 1 TABLET BY MOUTH EVERY DAY AT SUPPER TIME   hyoscyamine  (LEVSIN  SL) 0.125 MG SL tablet PLACE 1 TABLET (0.125 MG TOTAL) UNDER THE TONGUE EVERY 4 (FOUR) HOURS AS NEEDED.   IBUPROFEN  PO Take by mouth as needed.   Loratadine (CLARITIN PO) Take by mouth as needed.   naproxen sodium (ALEVE) 220 MG tablet Take 220 mg by mouth.   Omega-3 Fatty Acids (FISH OIL PO) Take by mouth. PRN   traZODone  (DESYREL ) 50 MG tablet 1/2-1 TABLET FOR SLEEP   No current facility-administered medications on file prior to visit.   Allergies:  Allergies  Allergen Reactions   Allegra [Fexofenadine Hcl]    Surgical History:  She  has a past surgical history that includes Wisdom tooth extraction. Family History:  Herfamily history includes Arthritis in her brother; Asthma in her son; Cancer in her father and paternal grandmother; Cancer - Prostate in her maternal grandfather; Diabetes in her mother; Endometrial cancer (age of onset: 74) in her maternal aunt; Hyperlipidemia in her mother; Leukemia in her father; Stroke in her maternal grandfather and maternal grandmother. Social History:   reports that she has never smoked. She has never used smokeless tobacco. She reports that she does not drink alcohol and does not use drugs.   ROS: all negative except above.   Physical Exam:  BP 102/62   Pulse 80   Temp (!) 97.5 F (36.4 C)   Ht 5' 5.5 (1.664 m)   Wt 145 lb 6.4 oz (66 kg)   SpO2 98%   BMI 23.83 kg/m   General Appearance: Well nourished, in no apparent distress. Eyes: PERRLA, EOMs, conjunctiva no swelling or erythema Sinuses: No Frontal/maxillary tenderness ENT/Mouth: Ext aud canals clear, TMs without erythema, bulging. No erythema, swelling, or exudate on post pharynx.   Hearing normal.  Neck: Supple, thyroid  normal.  Respiratory: Respiratory effort normal, BS equal bilaterally without rales, rhonchi, wheezing or stridor.  Cardio: RRR with no MRGs.  Brisk peripheral pulses without edema.  Abdomen: Soft, + BS.  Non tender, no guarding, rebound, hernias, masses. Lymphatics: Non tender without lymphadenopathy.  Musculoskeletal: Full ROM, 5/5 strength, normal gait.  Skin: Warm, dry without rashes, lesions, ecchymosis.  Neuro: Cranial nerves intact. Normal muscle tone, no cerebellar symptoms. Sensation intact.  Psych: Awake and oriented X 3, normal affect, Insight and Judgment appropriate.     Cassadi Purdie E Velencia Lenart, NP 10:34 AM [facility]

## 2023-04-16 ENCOUNTER — Ambulatory Visit (INDEPENDENT_AMBULATORY_CARE_PROVIDER_SITE_OTHER): Payer: 59 | Admitting: Nurse Practitioner

## 2023-04-16 ENCOUNTER — Encounter: Payer: Self-pay | Admitting: Nurse Practitioner

## 2023-04-16 VITALS — BP 102/62 | HR 80 | Temp 97.5°F | Ht 65.5 in | Wt 145.4 lb

## 2023-04-16 DIAGNOSIS — E782 Mixed hyperlipidemia: Secondary | ICD-10-CM

## 2023-04-16 DIAGNOSIS — E559 Vitamin D deficiency, unspecified: Secondary | ICD-10-CM

## 2023-04-16 DIAGNOSIS — G4700 Insomnia, unspecified: Secondary | ICD-10-CM | POA: Diagnosis not present

## 2023-04-16 DIAGNOSIS — Z79899 Other long term (current) drug therapy: Secondary | ICD-10-CM

## 2023-04-16 DIAGNOSIS — R823 Hemoglobinuria: Secondary | ICD-10-CM

## 2023-04-16 DIAGNOSIS — K589 Irritable bowel syndrome without diarrhea: Secondary | ICD-10-CM

## 2023-04-16 NOTE — Patient Instructions (Signed)

## 2023-04-17 LAB — COMPLETE METABOLIC PANEL WITH GFR
AG Ratio: 2 (calc) (ref 1.0–2.5)
ALT: 14 U/L (ref 6–29)
AST: 16 U/L (ref 10–35)
Albumin: 4.5 g/dL (ref 3.6–5.1)
Alkaline phosphatase (APISO): 65 U/L (ref 31–125)
BUN: 13 mg/dL (ref 7–25)
CO2: 25 mmol/L (ref 20–32)
Calcium: 9.2 mg/dL (ref 8.6–10.2)
Chloride: 108 mmol/L (ref 98–110)
Creat: 0.88 mg/dL (ref 0.50–0.99)
Globulin: 2.3 g/dL (ref 1.9–3.7)
Glucose, Bld: 82 mg/dL (ref 65–99)
Potassium: 4.1 mmol/L (ref 3.5–5.3)
Sodium: 140 mmol/L (ref 135–146)
Total Bilirubin: 0.4 mg/dL (ref 0.2–1.2)
Total Protein: 6.8 g/dL (ref 6.1–8.1)
eGFR: 82 mL/min/{1.73_m2} (ref >=60)

## 2023-04-17 LAB — CBC WITH DIFFERENTIAL/PLATELET
Absolute Lymphocytes: 1079 {cells}/uL (ref 850–3900)
Absolute Monocytes: 319 {cells}/uL (ref 200–950)
Basophils Absolute: 29 {cells}/uL (ref 0–200)
Basophils Relative: 0.7 %
Eosinophils Absolute: 29 {cells}/uL (ref 15–500)
Eosinophils Relative: 0.7 %
HCT: 40.9 % (ref 35.0–45.0)
Hemoglobin: 13.5 g/dL (ref 11.7–15.5)
MCH: 29.3 pg (ref 27.0–33.0)
MCHC: 33 g/dL (ref 32.0–36.0)
MCV: 88.7 fL (ref 80.0–100.0)
MPV: 12.7 fL — ABNORMAL HIGH (ref 7.5–12.5)
Monocytes Relative: 7.6 %
Neutro Abs: 2743 {cells}/uL (ref 1500–7800)
Neutrophils Relative %: 65.3 %
Platelets: 179 10*3/uL (ref 140–400)
RBC: 4.61 10*6/uL (ref 3.80–5.10)
RDW: 12.9 % (ref 11.0–15.0)
Total Lymphocyte: 25.7 %
WBC: 4.2 10*3/uL (ref 3.8–10.8)

## 2023-04-17 LAB — URINALYSIS, ROUTINE W REFLEX MICROSCOPIC
Bacteria, UA: NONE SEEN /[HPF]
Bilirubin Urine: NEGATIVE
Glucose, UA: NEGATIVE
Hyaline Cast: NONE SEEN /[LPF]
Ketones, ur: NEGATIVE
Leukocytes,Ua: NEGATIVE
Nitrite: NEGATIVE
Protein, ur: NEGATIVE
Specific Gravity, Urine: 1.021 (ref 1.001–1.035)
WBC, UA: NONE SEEN /[HPF] (ref 0–5)
pH: <=5 (ref 5.0–8.0)

## 2023-04-17 LAB — MICROSCOPIC MESSAGE

## 2023-04-17 LAB — LIPID PANEL
Cholesterol: 207 mg/dL — ABNORMAL HIGH (ref <200)
HDL: 44 mg/dL — ABNORMAL LOW (ref >=50)
LDL Cholesterol (Calc): 139 mg/dL — ABNORMAL HIGH
Non-HDL Cholesterol (Calc): 163 mg/dL — ABNORMAL HIGH (ref <130)
Total CHOL/HDL Ratio: 4.7 (calc) (ref <5.0)
Triglycerides: 118 mg/dL (ref <150)

## 2023-04-17 LAB — VITAMIN D 25 HYDROXY (VIT D DEFICIENCY, FRACTURES): Vit D, 25-Hydroxy: 34 ng/mL (ref 30–100)

## 2023-09-17 ENCOUNTER — Ambulatory Visit: Admitting: Family Medicine

## 2023-09-17 ENCOUNTER — Encounter: Payer: Self-pay | Admitting: Family Medicine

## 2023-09-17 VITALS — BP 122/74 | HR 85 | Temp 97.8°F | Ht 65.5 in | Wt 150.2 lb

## 2023-09-17 DIAGNOSIS — E559 Vitamin D deficiency, unspecified: Secondary | ICD-10-CM

## 2023-09-17 DIAGNOSIS — E782 Mixed hyperlipidemia: Secondary | ICD-10-CM | POA: Diagnosis not present

## 2023-09-17 DIAGNOSIS — G4700 Insomnia, unspecified: Secondary | ICD-10-CM

## 2023-09-17 DIAGNOSIS — K589 Irritable bowel syndrome without diarrhea: Secondary | ICD-10-CM

## 2023-09-17 DIAGNOSIS — Z8742 Personal history of other diseases of the female genital tract: Secondary | ICD-10-CM | POA: Diagnosis not present

## 2023-09-17 DIAGNOSIS — Z7689 Persons encountering health services in other specified circumstances: Secondary | ICD-10-CM | POA: Insufficient documentation

## 2023-09-17 DIAGNOSIS — R809 Proteinuria, unspecified: Secondary | ICD-10-CM

## 2023-09-17 NOTE — Assessment & Plan Note (Signed)
 Vitamin D  prior to CPE

## 2023-09-17 NOTE — Assessment & Plan Note (Signed)
 Today we reviewed your medical history, health maintenance items, and current concerns. Return in July for CPE or sooner if needed. Mammogram is due this year, let me know if I should order this if  you don't see GYN soon.

## 2023-09-17 NOTE — Progress Notes (Signed)
 New Patient Office Visit  Subjective    Patient ID: Alyssa Young, female    DOB: [birth year]  Age: 48 y.o. MRN: [id number]  CC:  Chief Complaint  Patient presents with   Establish Care    HPI Alyssa Young presents to establish care. Oriented to practice routines and expectations. Has been seeing PCP regularly. PMH includes PCOS, IBS, HLD, Vitamin d  deficiency, TMJ, insomnia, mitral valve prolapse. Alyssa Young does see GYN at [facility] annually, last visit and mammogram 2 years ago. Concerns include none. Conditions well controlled. She does endorse some early morning waking and increase in stress at work and home. Is not currently seeing a therapist but may be open to this soon. Is considering restarting Trazodone  25mg  nightly.    Health Maintenance  Topic Date Due   COVID-19 Vaccine (3 - Pfizer risk series) 10/03/2023 (Originally 06/27/2019)   Cervical Cancer Screening (HPV/Pap Cotest)  09/16/2024 (Originally 06/16/2006)   INFLUENZA VACCINE  11/15/2023   Fecal DNA (Cologuard)  11/07/2024   DTaP/Tdap/Td (4 - Td or Tdap) 04/07/2033   Hepatitis C Screening  Completed   HIV Screening  Completed   HPV VACCINES  Aged Out   Meningococcal B Vaccine  Aged Out      Outpatient Encounter Medications as of 09/17/2023  Medication Sig   Cholecalciferol (VITAMIN D ) 2000 units tablet Take 4,000 Units by mouth daily.   hyoscyamine  (LEVSIN SL) 0.125 MG SL tablet PLACE 1 TABLET (0.125 MG TOTAL) UNDER THE TONGUE EVERY 4 (FOUR) HOURS AS NEEDED.   IBUPROFEN  PO Take by mouth as needed.   Loratadine (CLARITIN PO) Take by mouth as needed.   traZODone  (DESYREL ) 50 MG tablet 1/2-1 TABLET FOR SLEEP   famotidine  (PEPCID ) 20 MG tablet TAKE 1 TABLET BY MOUTH EVERY DAY AT SUPPER TIME (Patient not taking: Reported on 09/17/2023)   naproxen sodium (ALEVE) 220 MG tablet Take 220 mg by mouth. (Patient not taking: Reported on 09/17/2023)   Omega-3 Fatty Acids (FISH OIL PO) Take by mouth.  PRN (Patient not taking: Reported on 09/17/2023)   No facility-administered encounter medications on file as of 09/17/2023.    Past Medical History:  Diagnosis Date   Allergy    Allegra D   Anxiety    GERD (gastroesophageal reflux disease)    Heart murmur    History of IBS    History of PCOS    Mitral prolapse    takes no anrtibiotics with dental work   Mixed hyperlipidemia 07/27/2013    Past Surgical History:  Procedure Laterality Date   WISDOM TOOTH EXTRACTION      Family History  Problem Relation Age of Onset   Diabetes Mother    Hyperlipidemia Mother    Cancer Father        leukemia   Leukemia Father    Learning disabilities Brother    Arthritis Brother    Stroke Maternal Grandmother        Mulitple   Cancer - Prostate Maternal Grandfather        prostate   Stroke Maternal Grandfather    Cancer Paternal Grandmother    Anxiety disorder Daughter    Asthma Son    Endometrial cancer Maternal Aunt 36   Anxiety disorder Son     Social History   Socioeconomic History   Marital status: Married    Spouse name: Takila Kronberg   Number of children: 3   Years of education: 16   Highest education level: Bachelor's  degree (e.g., BA, AB, BS)  Occupational History   Occupation: RN  Tobacco Use   Smoking status: Never   Smokeless tobacco: Never  Vaping Use   Vaping status: Never Used  Substance and Sexual Activity   Alcohol use: No    Comment: occ   Drug use: No   Sexual activity: Yes    Partners: Male    Birth control/protection: Surgical    Comment: Husband had vesictomy  Other Topics Concern   Not on file  Social History Narrative   Not on file   Social Drivers of Health   Financial Resource Strain: Low Risk  (09/10/2023)   Overall Financial Resource Strain (CARDIA)    Difficulty of Paying Living Expenses: Not very hard  Food Insecurity: No Food Insecurity (09/10/2023)   Hunger Vital Sign    Worried About Running Out of Food in the Last Year: Never  true    Ran Out of Food in the Last Year: Never true  Transportation Needs: No Transportation Needs (09/10/2023)   PRAPARE - Administrator, Civil Service (Medical): No    Lack of Transportation (Non-Medical): No  Physical Activity: Insufficiently Active (09/10/2023)   Exercise Vital Sign    Days of Exercise per Week: 1 day    Minutes of Exercise per Session: 30 min  Stress: Stress Concern Present (09/10/2023)   [employer] of Occupational Health - Occupational Stress Questionnaire    Feeling of Stress : Rather much  Social Connections: Unknown (09/10/2023)   Social Connection and Isolation Panel [NHANES]    Frequency of Communication with Friends and Family: Once a week    Frequency of Social Gatherings with Friends and Family: Not on file    Attends Religious Services: 1 to 4 times per year    Active Member of [employer] or Organizations: No    Attends Engineer, structural: Not on file    Marital Status: Married  Catering manager Violence: Not on file    Review of Systems  Respiratory: Negative.    Cardiovascular: Negative.   Gastrointestinal: Negative.   Psychiatric/Behavioral: Negative.          Objective    BP 122/74   Pulse 85   Temp 97.8 F (36.6 C)   Ht 5' 5.5" (1.664 m)   Wt 150 lb 3.2 oz (68.1 kg)   LMP 08/16/2023 (Exact Date)   SpO2 99%   BMI 24.61 kg/m   Physical Exam Vitals and nursing note reviewed.  Constitutional:      Appearance: Normal appearance. She is normal weight.  HENT:     Head: Normocephalic and atraumatic.  Cardiovascular:     Rate and Rhythm: Normal rate and regular rhythm.     Pulses: Normal pulses.     Heart sounds: Normal heart sounds.  Pulmonary:     Effort: Pulmonary effort is normal.     Breath sounds: Normal breath sounds.  Skin:    General: Skin is warm and dry.  Neurological:     General: No focal deficit present.     Mental Status: She is alert and oriented to person, place, and time. Mental status  is at baseline.  Psychiatric:        Mood and Affect: Mood normal.        Behavior: Behavior normal.        Thought Content: Thought content normal.        Judgment: Judgment normal.  Assessment & Plan:   Problem List Items Addressed This Visit     History of PCOS   Followed by GYN. Menstrual cycles are regular monthly.       Relevant Orders   CBC with Differential/Platelet   Comprehensive metabolic panel with GFR   IBS (irritable bowel syndrome)   Well controlled on Hyoscyamine . Cologuard UTD. Follow up PRN.      Mixed hyperlipidemia   Fasting labs prior to CPE. I recommend consuming a heart healthy diet such as Mediterranean diet or DASH diet with whole grains, fruits, vegetable, fish, lean meats, nuts, and olive oil. Limit sweets and processed foods. I also encourage moderate intensity exercise 150 minutes weekly. This is 3-5 times weekly for 30-50 minutes each session. Goal should be pace of 3 miles/hours, or walking 1.5 miles in 30 minutes. 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 Considering restarting Trazodone , will notify office if needed.       Proteinuria   UA today      Relevant Orders   Urinalysis, Routine w reflex microscopic   Encounter to establish care with new provider - Primary   Today we reviewed your medical history, health maintenance items, and current concerns. Return in July for CPE or sooner if needed. Mammogram is due this year, let me know if I should order this if  you don't see GYN soon.      Relevant Orders   CBC with Differential/Platelet   Comprehensive metabolic panel with GFR   Lipid panel   VITAMIN D  25 Hydroxy (Vit-D  Deficiency, Fractures)   Cortisol, free, Serum    No follow-ups on file.   Jenelle Mis, FNP

## 2023-09-17 NOTE — Assessment & Plan Note (Signed)
 Fasting labs prior to CPE. I recommend consuming a heart healthy diet such as Mediterranean diet or DASH diet with whole grains, fruits, vegetable, fish, lean meats, nuts, and olive oil. Limit sweets and processed foods. I also encourage moderate intensity exercise 150 minutes weekly. This is 3-5 times weekly for 30-50 minutes each session. Goal should be pace of 3 miles/hours, or walking 1.5 miles in 30 minutes. The 10-year ASCVD risk score (Arnett DK, et al., 2019) is: 1.3%

## 2023-09-17 NOTE — Assessment & Plan Note (Signed)
UA today

## 2023-09-17 NOTE — Assessment & Plan Note (Signed)
 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 Considering restarting Trazodone , will notify office if needed.

## 2023-09-17 NOTE — Assessment & Plan Note (Signed)
 Followed by GYN. Menstrual cycles are regular monthly.

## 2023-09-17 NOTE — Assessment & Plan Note (Signed)
 Well controlled on Hyoscyamine . Cologuard UTD. Follow up PRN.

## 2023-10-09 ENCOUNTER — Encounter: Payer: 59 | Admitting: Nurse Practitioner

## 2023-10-16 ENCOUNTER — Other Ambulatory Visit

## 2023-10-16 DIAGNOSIS — E782 Mixed hyperlipidemia: Secondary | ICD-10-CM

## 2023-10-16 DIAGNOSIS — Z8742 Personal history of other diseases of the female genital tract: Secondary | ICD-10-CM

## 2023-10-16 DIAGNOSIS — E559 Vitamin D deficiency, unspecified: Secondary | ICD-10-CM

## 2023-10-16 DIAGNOSIS — K589 Irritable bowel syndrome without diarrhea: Secondary | ICD-10-CM

## 2023-10-16 DIAGNOSIS — Z7689 Persons encountering health services in other specified circumstances: Secondary | ICD-10-CM

## 2023-10-16 LAB — COMPREHENSIVE METABOLIC PANEL WITH GFR
AG Ratio: 2.2 (calc) (ref 1.0–2.5)
ALT: 18 U/L (ref 6–29)
AST: 18 U/L (ref 10–35)
Albumin: 4.6 g/dL (ref 3.6–5.1)
Alkaline phosphatase (APISO): 58 U/L (ref 31–125)
BUN: 13 mg/dL (ref 7–25)
CO2: 23 mmol/L (ref 20–32)
Calcium: 9.2 mg/dL (ref 8.6–10.2)
Chloride: 108 mmol/L (ref 98–110)
Creat: 0.86 mg/dL (ref 0.50–0.99)
Globulin: 2.1 g/dL (ref 1.9–3.7)
Glucose, Bld: 93 mg/dL (ref 65–99)
Potassium: 3.9 mmol/L (ref 3.5–5.3)
Sodium: 138 mmol/L (ref 135–146)
Total Bilirubin: 0.5 mg/dL (ref 0.2–1.2)
Total Protein: 6.7 g/dL (ref 6.1–8.1)
eGFR: 84 mL/min/{1.73_m2} (ref >=60)

## 2023-10-16 LAB — CBC WITH DIFFERENTIAL/PLATELET
Absolute Lymphocytes: 1198 {cells}/uL (ref 850–3900)
Absolute Monocytes: 413 {cells}/uL (ref 200–950)
Basophils Absolute: 21 {cells}/uL (ref 0–200)
Basophils Relative: 0.4 %
Eosinophils Absolute: 48 {cells}/uL (ref 15–500)
Eosinophils Relative: 0.9 %
HCT: 39.8 % (ref 35.0–45.0)
Hemoglobin: 13.1 g/dL (ref 11.7–15.5)
MCH: 29.4 pg (ref 27.0–33.0)
MCHC: 32.9 g/dL (ref 32.0–36.0)
MCV: 89.4 fL (ref 80.0–100.0)
MPV: 12.4 fL (ref 7.5–12.5)
Monocytes Relative: 7.8 %
Neutro Abs: 3620 {cells}/uL (ref 1500–7800)
Neutrophils Relative %: 68.3 %
Platelets: 176 10*3/uL (ref 140–400)
RBC: 4.45 10*6/uL (ref 3.80–5.10)
RDW: 12.8 % (ref 11.0–15.0)
Total Lymphocyte: 22.6 %
WBC: 5.3 10*3/uL (ref 3.8–10.8)

## 2023-10-16 LAB — LIPID PANEL
Cholesterol: 204 mg/dL — ABNORMAL HIGH (ref <200)
HDL: 43 mg/dL — ABNORMAL LOW (ref >=50)
LDL Cholesterol (Calc): 138 mg/dL — ABNORMAL HIGH
Non-HDL Cholesterol (Calc): 161 mg/dL — ABNORMAL HIGH (ref <130)
Total CHOL/HDL Ratio: 4.7 (calc) (ref <5.0)
Triglycerides: 112 mg/dL (ref <150)

## 2023-10-16 LAB — VITAMIN D 25 HYDROXY (VIT D DEFICIENCY, FRACTURES): Vit D, 25-Hydroxy: 44 ng/mL (ref 30–100)

## 2023-10-17 ENCOUNTER — Ambulatory Visit: Payer: Self-pay | Admitting: Family Medicine

## 2023-10-17 LAB — URINALYSIS, ROUTINE W REFLEX MICROSCOPIC
Bilirubin Urine: NEGATIVE
Glucose, UA: NEGATIVE
Hgb urine dipstick: NEGATIVE
Ketones, ur: NEGATIVE
Leukocytes,Ua: NEGATIVE
Nitrite: NEGATIVE
Protein, ur: NEGATIVE
Specific Gravity, Urine: 1.025 (ref 1.001–1.035)
pH: 5.5 (ref 5.0–8.0)

## 2023-10-23 ENCOUNTER — Encounter: Payer: Self-pay | Admitting: Family Medicine

## 2023-10-23 ENCOUNTER — Ambulatory Visit: Admitting: Family Medicine

## 2023-10-23 VITALS — BP 110/64 | HR 88 | Temp 98.6°F | Ht 65.5 in | Wt 147.5 lb

## 2023-10-23 DIAGNOSIS — G4700 Insomnia, unspecified: Secondary | ICD-10-CM

## 2023-10-23 DIAGNOSIS — Z0001 Encounter for general adult medical examination with abnormal findings: Secondary | ICD-10-CM | POA: Diagnosis not present

## 2023-10-23 DIAGNOSIS — E782 Mixed hyperlipidemia: Secondary | ICD-10-CM

## 2023-10-23 DIAGNOSIS — K219 Gastro-esophageal reflux disease without esophagitis: Secondary | ICD-10-CM

## 2023-10-23 DIAGNOSIS — K589 Irritable bowel syndrome without diarrhea: Secondary | ICD-10-CM | POA: Diagnosis not present

## 2023-10-23 DIAGNOSIS — Z0000 Encounter for general adult medical examination without abnormal findings: Secondary | ICD-10-CM | POA: Insufficient documentation

## 2023-10-23 DIAGNOSIS — Z23 Encounter for immunization: Secondary | ICD-10-CM

## 2023-10-23 MED ORDER — TRAZODONE HCL 50 MG PO TABS: ORAL_TABLET | ORAL | 1 refills | Status: AC

## 2023-10-23 MED ORDER — HYOSCYAMINE SULFATE 0.125 MG SL SUBL: 0.1250 mg | SUBLINGUAL_TABLET | SUBLINGUAL | 0 refills | Status: DC | PRN

## 2023-10-23 NOTE — Progress Notes (Signed)
 Complete physical exam  Patient: Alyssa Young   DOB: [birth year]   48 y.o. Female  MRN: [id number]  Subjective:    No chief complaint on file.   Marise Knapper is a 48 y.o. female who presents today for a complete physical exam. She reports consuming a general diet. The patient does not participate in regular exercise at present. She generally feels well. She reports sleeping well. She does have additional problems to discuss today including epigastric pain 4-5 times monthly when eating, feeling as though food gets stuck and causes pain. She does have PMH of GERD and IBS. Uses Pepcid  occasionally. No nausea, vomiting, melena, hematochezia, abdominal pain, NSAID use.   The 10-year ASCVD risk score (Arnett DK, et al., 2019) is: 1%   Values used to calculate the score:     Age: 59 years     Clincally relevant sex: Female     Is Non-Hispanic African American: No     Diabetic: No     Tobacco smoker: No     Systolic Blood Pressure: 110 mmHg     Is BP treated: No     HDL Cholesterol: 43 mg/dL     Total Cholesterol: 204 mg/dL   Most recent fall risk assessment:    09/17/2023   10:19 AM  Fall Risk   Falls in the past year? 0  Number falls in past yr: 0  Injury with Fall? 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   History of mitral valve prolapse 10/06/2018   Vitamin D  deficiency 09/30/2017   Mixed  hyperlipidemia 07/27/2013   History of PCOS    IBS (irritable bowel syndrome)    Past Medical History:  Diagnosis Date   Allergy    Allegra D   Anxiety    GERD (gastroesophageal reflux disease)    Heart murmur    History of IBS    History of PCOS    Mitral prolapse    takes no anrtibiotics with dental work   Mixed hyperlipidemia 07/27/2013   Past Surgical History:  Procedure Laterality Date   WISDOM TOOTH EXTRACTION     Social History   Tobacco Use   Smoking status: Never   Smokeless tobacco: Never  Vaping Use   Vaping status: Never Used  Substance Use Topics   Alcohol use: No    Comment: occ   Drug use: No   Family History  Problem Relation Age of Onset   Diabetes Mother    Hyperlipidemia Mother    Cancer Father        leukemia   Leukemia Father    Learning disabilities Brother    Arthritis Brother    Stroke Maternal Grandmother        Mulitple   Cancer - Prostate Maternal Grandfather        prostate  Stroke Maternal Grandfather    Cancer Paternal Grandmother    COPD Paternal Grandmother    Anxiety disorder Daughter    Asthma Son    Endometrial cancer Maternal Aunt 35   Anxiety disorder Son    Allergies  Allergen Reactions   Loreli Leash Hcl]       Patient Care Team: Kayla Jeoffrey RAMAN, FNP as PCP - General (Family Medicine) Mat Browning, MD as Consulting Physician (Obstetrics and Gynecology) Robinson Mayo, OD as Referring Physician (Optometry)   Outpatient Medications Prior to Visit  Medication Sig   Cholecalciferol (VITAMIN D ) 2000 units tablet Take 4,000 Units by mouth daily.   famotidine  (PEPCID ) 20 MG tablet TAKE 1 TABLET BY MOUTH EVERY DAY AT SUPPER TIME   IBUPROFEN  PO Take by mouth as needed.   Loratadine (CLARITIN PO) Take by mouth as needed.   naproxen sodium (ALEVE) 220 MG tablet Take 220 mg by mouth.   Omega-3 Fatty Acids (FISH OIL PO) Take by mouth. PRN   [DISCONTINUED] hyoscyamine  (LEVSIN  SL) 0.125 MG SL tablet PLACE 1  TABLET (0.125 MG TOTAL) UNDER THE TONGUE EVERY 4 (FOUR) HOURS AS NEEDED.   [DISCONTINUED] traZODone  (DESYREL ) 50 MG tablet 1/2-1 TABLET FOR SLEEP   No facility-administered medications prior to visit.    Review of Systems  Constitutional: Negative.   HENT: Negative.    Eyes: Negative.   Respiratory: Negative.    Cardiovascular: Negative.   Gastrointestinal:  Positive for diarrhea and heartburn.  Genitourinary: Negative.   Musculoskeletal: Negative.   Skin: Negative.   Neurological: Negative.   Endo/Heme/Allergies: Negative.   Psychiatric/Behavioral:  The patient has insomnia.   All other systems reviewed and are negative.         Objective:     BP 110/64   Pulse 88   Temp 98.6 F (37 C)   Ht 5' 5.5 (1.664 m)   Wt 147 lb 8 oz (66.9 kg)   LMP 10/23/2023 (Exact Date)   SpO2 98%   BMI 24.17 kg/m  BP Readings from Last 3 Encounters:  10/23/23 110/64  09/17/23 122/74  04/16/23 102/62   Wt Readings from Last 3 Encounters:  10/23/23 147 lb 8 oz (66.9 kg)  09/17/23 150 lb 3.2 oz (68.1 kg)  04/16/23 145 lb 6.4 oz (66 kg)      Physical Exam Vitals and nursing note reviewed.  Constitutional:      Appearance: Normal appearance. She is normal weight.  HENT:     Head: Normocephalic and atraumatic.     Right Ear: Tympanic membrane, ear canal and external ear normal.     Left Ear: Tympanic membrane, ear canal and external ear normal.     Nose: Nose normal.     Mouth/Throat:     Mouth: Mucous membranes are moist.     Pharynx: Oropharynx is clear.  Eyes:     Extraocular Movements: Extraocular movements intact.     Conjunctiva/sclera: Conjunctivae normal.     Pupils: Pupils are equal, round, and reactive to light.  Cardiovascular:     Rate and Rhythm: Normal rate and regular rhythm.     Pulses: Normal pulses.     Heart sounds: Normal heart sounds.  Pulmonary:     Effort: Pulmonary effort is normal.     Breath sounds: Normal breath sounds.  Abdominal:      General: Bowel sounds are normal.     Palpations: Abdomen is soft.  Musculoskeletal:        General: Normal range  of motion.     Cervical back: Normal range of motion and neck supple.  Skin:    General: Skin is warm and dry.     Capillary Refill: Capillary refill takes less than 2 seconds.  Neurological:     General: No focal deficit present.     Mental Status: She is alert and oriented to person, place, and time. Mental status is at baseline.  Psychiatric:        Mood and Affect: Mood normal.        Behavior: Behavior normal.        Thought Content: Thought content normal.        Judgment: Judgment normal.      No results found for any visits on 10/23/23. Last CBC Lab Results  Component Value Date   WBC 5.3 10/16/2023   HGB 13.1 10/16/2023   HCT 39.8 10/16/2023   MCV 89.4 10/16/2023   MCH 29.4 10/16/2023   RDW 12.8 10/16/2023   PLT 176 10/16/2023   Last metabolic panel Lab Results  Component Value Date   GLUCOSE 93 10/16/2023   NA 138 10/16/2023   K 3.9 10/16/2023   CL 108 10/16/2023   CO2 23 10/16/2023   BUN 13 10/16/2023   CREATININE 0.86 10/16/2023   EGFR 84 10/16/2023   CALCIUM 9.2 10/16/2023   PROT 6.7 10/16/2023   ALBUMIN 4.7 09/19/2016   BILITOT 0.5 10/16/2023   ALKPHOS 51 09/19/2016   AST 18 10/16/2023   ALT 18 10/16/2023   Last lipids Lab Results  Component Value Date   CHOL 204 (H) 10/16/2023   HDL 43 (L) 10/16/2023   LDLCALC 138 (H) 10/16/2023   TRIG 112 10/16/2023   CHOLHDL 4.7 10/16/2023   Last hemoglobin A1c Lab Results  Component Value Date   HGBA1C 5.2 10/06/2018   Last thyroid  functions Lab Results  Component Value Date   TSH 2.22 10/09/2022   Last vitamin D  Lab Results  Component Value Date   VD25OH 44 10/16/2023   Last vitamin B12 and Folate Lab Results  Component Value Date   VITAMINB12 537 10/02/2017        Assessment & Plan:    Routine Health Maintenance and Physical Exam  Immunization History  Administered  Date(s) Administered   Hep B, Unspecified 11/24/1994   Influenza, Quadrivalent, Recombinant, Inj, Pf 02/08/2018   Influenza,inj,Quad PF,6+ Mos 02/11/2017, 04/08/2023   Influenza-Unspecified 02/13/2019, 03/07/2020, 03/14/2021, 03/14/2022   MMR 06/23/1980   PFIZER(Purple Top)SARS-COV-2 Vaccination 05/01/2019, 05/30/2019   PPD Test 08/05/2015   Td 10/06/2001   Tdap 01/15/2012, 04/08/2023    Health Maintenance  Topic Date Due   COVID-19 Vaccine (3 - Pfizer risk series) 11/08/2023 (Originally 06/27/2019)   Cervical Cancer Screening (HPV/Pap Cotest)  09/16/2024 (Originally 06/16/2006)   Hepatitis B Vaccines (1 of 3 - 19+ 3-dose series) 10/22/2024 (Originally 06/16/1995)   INFLUENZA VACCINE  11/15/2023   Fecal DNA (Cologuard)  11/07/2024   DTaP/Tdap/Td (4 - Td or Tdap) 04/07/2033   Hepatitis C Screening  Completed   HIV Screening  Completed   HPV VACCINES  Aged Out   Meningococcal B Vaccine  Aged Out    Discussed health benefits of physical activity, and encouraged her to engage in regular exercise appropriate for her age and condition.  Problem List Items Addressed This Visit     IBS (irritable bowel syndrome)   Well controlled on Hyoscyamine . Cologuard UTD. Follow up PRN.      Relevant Medications   hyoscyamine  (LEVSIN   SL) 0.125 MG SL tablet   Mixed hyperlipidemia   Elevated cholesterol levels discussed. I recommend consuming a heart healthy diet such as Mediterranean diet or DASH diet with whole grains, fruits, vegetable, fish, lean meats, nuts, and olive oil. Limit sweets and processed foods. I also encourage moderate intensity exercise 150 minutes weekly. This is 3-5 times weekly for 30-50 minutes each session. Goal should be pace of 3 miles/hours, or walking 1.5 miles in 30 minutes. 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 Would like to  restart Trazodone  50mg  daily.       Relevant Medications   traZODone  (DESYREL ) 50 MG tablet   Physical exam, annual - Primary   Today your medical history was reviewed and routine physical exam with labs was performed. Recommend 150 minutes of moderate intensity exercise weekly and consuming a well-balanced diet. Advised to stop smoking if a smoker, avoid smoking if a non-smoker, limit alcohol consumption to 1 drink per day for women and 2 drinks per day for men, and avoid illicit drug use. Counseled on safe sex practices and offered STI testing today. Counseled on the importance of sunscreen use. Counseled in mental health awareness and when to seek medical care. Vaccine maintenance discussed. Appropriate health maintenance items reviewed. Return to office in 1 year for annual physical exam.       Gastroesophageal reflux disease without esophagitis   Continue Pepcid  20mg  daily. Elevated HOB if needed and avoid lying down 2-3 hours after eating, avoid coffee, alcohol, chocolate, fatty foods, citrus, carbonated beverages, spicy foods, late meals, and smoking. Return to office if symptoms return or worsen and seek medical care for difficulty swallowing, bleeding, anemia, weight loss, or recurrent vomiting.        Relevant Medications   hyoscyamine  (LEVSIN  SL) 0.125 MG SL tablet   Other Visit Diagnoses       Need for hepatitis vaccination          Return in about 1 year (around 10/22/2024) for annual physical with labs 1 week prior.     Jeoffrey GORMAN Barrio, FNP

## 2023-10-23 NOTE — Assessment & Plan Note (Signed)
 Well controlled on Hyoscyamine . Cologuard UTD. Follow up PRN.

## 2023-10-23 NOTE — Assessment & Plan Note (Signed)
 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 Would like to restart Trazodone  50mg  daily.

## 2023-10-23 NOTE — Assessment & Plan Note (Signed)
 Continue Pepcid  20mg  daily. Elevated HOB if needed and avoid lying down 2-3 hours after eating, avoid coffee, alcohol, chocolate, fatty foods, citrus, carbonated beverages, spicy foods, late meals, and smoking. Return to office if symptoms return or worsen and seek medical care for difficulty swallowing, bleeding, anemia, weight loss, or recurrent vomiting.

## 2023-10-23 NOTE — Assessment & Plan Note (Addendum)
 Elevated cholesterol levels discussed. I recommend consuming a heart healthy diet such as Mediterranean diet or DASH diet with whole grains, fruits, vegetable, fish, lean meats, nuts, and olive oil. Limit sweets and processed foods. I also encourage moderate intensity exercise 150 minutes weekly. This is 3-5 times weekly for 30-50 minutes each session. Goal should be pace of 3 miles/hours, or walking 1.5 miles in 30 minutes. The 10-year ASCVD risk score (Arnett DK, et al., 2019) is: 1%

## 2023-10-23 NOTE — Assessment & Plan Note (Signed)

## 2023-10-26 LAB — CORTISOL, FREE: Cortisol Free, Ser: 0.31 ug/dL

## 2023-10-26 LAB — EXTRA SPECIMEN

## 2023-10-30 ENCOUNTER — Other Ambulatory Visit: Payer: Self-pay | Admitting: Family Medicine

## 2023-10-30 DIAGNOSIS — K589 Irritable bowel syndrome without diarrhea: Secondary | ICD-10-CM

## 2023-12-02 ENCOUNTER — Other Ambulatory Visit: Payer: Self-pay | Admitting: Obstetrics and Gynecology

## 2023-12-02 DIAGNOSIS — N6323 Unspecified lump in the left breast, lower outer quadrant: Secondary | ICD-10-CM

## 2023-12-10 ENCOUNTER — Other Ambulatory Visit: Payer: Self-pay | Admitting: Obstetrics and Gynecology

## 2023-12-10 ENCOUNTER — Ambulatory Visit
Admission: RE | Admit: 2023-12-10 | Discharge: 2023-12-10 | Disposition: A | Discharge status: Home / Self Care | Source: Ambulatory Visit | Attending: Obstetrics and Gynecology | Admitting: Obstetrics and Gynecology

## 2023-12-10 DIAGNOSIS — N6323 Unspecified lump in the left breast, lower outer quadrant: Secondary | ICD-10-CM

## 2023-12-10 DIAGNOSIS — R928 Other abnormal and inconclusive findings on diagnostic imaging of breast: Secondary | ICD-10-CM

## 2024-01-27 ENCOUNTER — Ambulatory Visit: Payer: Self-pay

## 2024-01-27 ENCOUNTER — Telehealth: Admitting: Physician Assistant

## 2024-01-27 DIAGNOSIS — J0100 Acute maxillary sinusitis, unspecified: Secondary | ICD-10-CM | POA: Diagnosis not present

## 2024-01-27 DIAGNOSIS — U071 COVID-19: Secondary | ICD-10-CM

## 2024-01-27 MED ORDER — DOXYCYCLINE HYCLATE 100 MG PO TABS: 100.0000 mg | ORAL_TABLET | Freq: Two times a day (BID) | ORAL | 0 refills | Status: AC

## 2024-01-27 MED ORDER — BENZONATATE 100 MG PO CAPS: 100.0000 mg | ORAL_CAPSULE | Freq: Three times a day (TID) | ORAL | 0 refills | Status: AC | PRN

## 2024-01-27 MED ORDER — FLUTICASONE PROPIONATE 50 MCG/ACT NA SUSP: 2.0000 | Freq: Every day | NASAL | 0 refills | Status: AC

## 2024-01-27 NOTE — Telephone Encounter (Signed)
 FYI Only or Action Required?: FYI only for provider.  Patient was last seen in primary care on 10/23/2023 by Kayla Jeoffrey RAMAN, FNP.  Called Nurse Triage reporting Covid Positive.  Symptoms began several days ago.  Interventions attempted: OTC medications: DayQuil, NyQuil, Tylenol  and Rest, hydration, or home remedies.  Symptoms are: gradually worsening.  Triage Disposition: Call PCP Within 24 Hours  Patient/caregiver understands and will follow disposition?: Yes Reason for Disposition  [1] Patient is NOT HIGH RISK AND [2] strongly requests antiviral medicine AND [3] COVID-19 symptoms present < 5 days    Intense Sinus Pressure  Answer Assessment - Initial Assessment Questions Taken DayQuil, NyQuil, Tylenol , taken some cough medicine husband was prescribed for Covid last week.   1. SYMPTOMS: What is your main symptom or concern? (e.g., cough, fever, shortness of breath, muscle aches)     Sinus pressure behind eyes, notes, so stuffy, headache. states felt worse today than the last 2 days  2. ONSET: When did the symptoms start?      Friday  3. COUGH: Do you have a cough? If Yes, ask: How bad is the cough?       Slight cough  4. FEVER: Do you have a fever? If Yes, ask: What is your temperature, how was it measured, an d when did it start?     Low grade temp, 100  5. BETTER-SAME-WORSE: Are you getting better, staying the same or getting worse compared to yesterday?  If getting worse, ask, In what way?     Worse, thinks its turning into a sinus infection  6. OTHER SYMPTOMS: Do you have any other symptoms?  (e.g., chills, fatigue, headache, loss of smell or taste, muscle pain, sore throat)     Headache, intense sinus pressure  7. COVID-19 DIAGNOSIS: How do you know that you have COVID? (e.g., positive lab test or self-test, diagnosed by doctor or NP/PA, symptoms after exposure).     At home test  Protocols used: COVID-19 - Diagnosed or Suspected-A-AH  Copied  from CRM #[id number]. Topic: Clinical - Red Word Triage >> Jan 27, 2024  3:07 PM Joesph NOVAK wrote: Red Word that prompted transfer to Nurse Triage: Positive for covid on Saturday, pressure behind nose and eyes, headache, coughing, congestion

## 2024-01-27 NOTE — Patient Instructions (Signed)
 Alyssa Young, thank you for joining Elsie Velma Lunger, PA-C for today's virtual visit.  While this provider is not your primary care provider (PCP), if your PCP is located in our provider database this encounter information will be shared with them immediately following your visit.   A [facility] MyChart account gives you access to today's visit and all your visits, tests, and labs performed at [facility]  click here if you don't have a [facility] MyChart account or go to [email or web address]  Consent: (Patient) Alyssa Young provided verbal consent for this virtual visit at the beginning of the encounter.  Current Medications:  Current Outpatient Medications:    benzonatate (TESSALON) 100 MG capsule, Take 1 capsule (100 mg total) by mouth 3 (three) times daily as needed for cough., Disp: 30 capsule, Rfl: 0   doxycycline (VIBRA-TABS) 100 MG tablet, Take 1 tablet (100 mg total) by mouth 2 (two) times daily., Disp: 20 tablet, Rfl: 0   fluticasone (FLONASE) 50 MCG/ACT nasal spray, Place 2 sprays into both nostrils daily., Disp: 16 g, Rfl: 0   Cholecalciferol (VITAMIN D ) 2000 units tablet, Take 4,000 Units by mouth daily., Disp: , Rfl:    famotidine  (PEPCID ) 20 MG tablet, TAKE 1 TABLET BY MOUTH EVERY DAY AT SUPPER TIME, Disp: 90 tablet, Rfl: 1   hyoscyamine  (LEVSIN  SL) 0.125 MG SL tablet, PLACE 1 TABLET (0.125 MG TOTAL) UNDER THE TONGUE EVERY 4 (FOUR) HOURS AS NEEDED., Disp: 540 tablet, Rfl: 1   IBUPROFEN  PO, Take by mouth as needed., Disp: , Rfl:    Loratadine (CLARITIN PO), Take by mouth as needed., Disp: , Rfl:    naproxen sodium (ALEVE) 220 MG tablet, Take 220 mg by mouth., Disp: , Rfl:    Omega-3 Fatty Acids (FISH OIL PO), Take by mouth. PRN, Disp: , Rfl:    traZODone  (DESYREL ) 50 MG tablet, 1/2-1 tablet for sleep, Disp: 90 tablet, Rfl: 1   Medications ordered in this encounter:  Meds ordered this encounter  Medications   benzonatate (TESSALON)  100 MG capsule    Sig: Take 1 capsule (100 mg total) by mouth 3 (three) times daily as needed for cough.    Dispense:  30 capsule    Refill:  0    Supervising Provider:   LAMPTEY, PHILIP O [[id number]]   fluticasone (FLONASE) 50 MCG/ACT nasal spray    Sig: Place 2 sprays into both nostrils daily.    Dispense:  16 g    Refill:  0    Supervising Provider:   BLAISE ALEENE KIDD [[id number]]   doxycycline (VIBRA-TABS) 100 MG tablet    Sig: Take 1 tablet (100 mg total) by mouth 2 (two) times daily.    Dispense:  20 tablet    Refill:  0    Supervising Provider:   LAMPTEY, PHILIP O [[id number]]     *If you need refills on other medications prior to your next appointment, please contact your pharmacy*  Follow-Up: Call back or seek an in-person evaluation if the symptoms worsen or if the condition fails to improve as anticipated.  [facility] [phone]  Care Instructions: Please keep well-hydrated and get plenty of rest. Start a saline nasal rinse to flush out your nasal passages. You can use plain Mucinex to help thin congestion. If you have a humidifier, running in the bedroom at night. Please take prescribed medications as directed for the secondary sinusitis. If you note any non-resolving, new, or worsening symptoms despite treatment, please  seek an in-person evaluation ASAP.    Isolation Instructions: You are to isolate at home until you have been fever free for at least 24 hours without a fever-reducing medication, and symptoms have been steadily improving for 24 hours. At that time,  you can end isolation but need to mask for an additional 5 days.   If you must be around other household members who do not have symptoms, you need to make sure that both you and the family members are masking consistently with a high-quality mask.  If you note any worsening of symptoms despite treatment, please seek an in-person evaluation ASAP. If you note any significant shortness of breath  or any chest pain, please seek ER evaluation. Please do not delay care!   COVID-19: What to Do if You Are Sick If you test positive and are an older adult or someone who is at high risk of getting very sick from COVID-19, treatment may be available. Contact a healthcare provider right away after a positive test to determine if you are eligible, even if your symptoms are mild right now. You can also visit a Test to Treat location and, if eligible, receive a prescription from a provider. Don't delay: Treatment must be started within the first few days to be effective. If you have a fever, cough, or other symptoms, you might have COVID-19. Most people have mild illness and are able to recover at home. If you are sick: Keep track of your symptoms. If you have an emergency warning sign (including trouble breathing), call 911. Steps to help prevent the spread of COVID-19 if you are sick If you are sick with COVID-19 or think you might have COVID-19, follow the steps below to care for yourself and to help protect other people in your home and community. Stay home except to get medical care Stay home. Most people with COVID-19 have mild illness and can recover at home without medical care. Do not leave your home, except to get medical care. Do not visit public areas and do not go to places where you are unable to wear a mask. Take care of yourself. Get rest and stay hydrated. Take over-the-counter medicines, such as acetaminophen , to help you feel better. Stay in touch with your doctor. Call before you get medical care. Be sure to get care if you have trouble breathing, or have any other emergency warning signs, or if you think it is an emergency. Avoid public transportation, ride-sharing, or taxis if possible. Get tested If you have symptoms of COVID-19, get tested. While waiting for test results, stay away from others, including staying apart from those living in your household. Get tested as soon as  possible after your symptoms start. Treatments may be available for people with COVID-19 who are at risk for becoming very sick. Don't delay: Treatment must be started early to be effective--some treatments must begin within 5 days of your first symptoms. Contact your healthcare provider right away if your test result is positive to determine if you are eligible. Self-tests are one of several options for testing for the virus that causes COVID-19 and may be more convenient than laboratory-based tests and point-of-care tests. Ask your healthcare provider or your local health department if you need help interpreting your test results. You can visit your state, tribal, local, and territorial health department's website to look for the latest local information on testing sites. Separate yourself from other people As much as possible, stay in a specific room and  away from other people and pets in your home. If possible, you should use a separate bathroom. If you need to be around other people or animals in or outside of the home, wear a well-fitting mask. Tell your close contacts that they may have been exposed to COVID-19. An infected person can spread COVID-19 starting 48 hours (or 2 days) before the person has any symptoms or tests positive. By letting your close contacts know they may have been exposed to COVID-19, you are helping to protect everyone. See COVID-19 and Animals if you have questions about pets. If you are diagnosed with COVID-19, someone from the health department may call you. Answer the call to slow the spread. Monitor your symptoms Symptoms of COVID-19 include fever, cough, or other symptoms. Follow care instructions from your healthcare provider and local health department. Your local health authorities may give instructions on checking your symptoms and reporting information. When to seek emergency medical attention Look for emergency warning signs* for COVID-19. If someone is showing  any of these signs, seek emergency medical care immediately: Trouble breathing Persistent pain or pressure in the chest New confusion Inability to wake or stay awake Pale, gray, or blue-colored skin, lips, or nail beds, depending on skin tone *This list is not all possible symptoms. Please call your medical provider for any other symptoms that are severe or concerning to you. Call 911 or call ahead to your local emergency facility: Notify the operator that you are seeking care for someone who has or may have COVID-19. Call ahead before visiting your doctor Call ahead. Many medical visits for routine care are being postponed or done by phone or telemedicine. If you have a medical appointment that cannot be postponed, call your doctor's office, and tell them you have or may have COVID-19. This will help the office protect themselves and other patients. If you are sick, wear a well-fitting mask You should wear a mask if you must be around other people or animals, including pets (even at home). Wear a mask with the best fit, protection, and comfort for you. You don't need to wear the mask if you are alone. If you can't put on a mask (because of trouble breathing, for example), cover your coughs and sneezes in some other way. Try to stay at least 6 feet away from other people. This will help protect the people around you. Masks should not be placed on young children under age 50 years, anyone who has trouble breathing, or anyone who is not able to remove the mask without help. Cover your coughs and sneezes Cover your mouth and nose with a tissue when you cough or sneeze. Throw away used tissues in a lined trash can. Immediately wash your hands with soap and water for at least 20 seconds. If soap and water are not available, clean your hands with an alcohol-based hand sanitizer that contains at least 60% alcohol. Clean your hands often Wash your hands often with soap and water for at least 20 seconds.  This is especially important after blowing your nose, coughing, or sneezing; going to the bathroom; and before eating or preparing food. Use hand sanitizer if soap and water are not available. Use an alcohol-based hand sanitizer with at least 60% alcohol, covering all surfaces of your hands and rubbing them together until they feel dry. Soap and water are the best option, especially if hands are visibly dirty. Avoid touching your eyes, nose, and mouth with unwashed hands. Handwashing Tips Avoid sharing  personal household items Do not share dishes, drinking glasses, cups, eating utensils, towels, or bedding with other people in your home. Wash these items thoroughly after using them with soap and water or put in the dishwasher. Clean surfaces in your home regularly Clean and disinfect high-touch surfaces (for example, doorknobs, tables, handles, light switches, and countertops) in your sick room and bathroom. In shared spaces, you should clean and disinfect surfaces and items after each use by the person who is ill. If you are sick and cannot clean, a caregiver or other person should only clean and disinfect the area around you (such as your bedroom and bathroom) on an as needed basis. Your caregiver/other person should wait as long as possible (at least several hours) and wear a mask before entering, cleaning, and disinfecting shared spaces that you use. Clean and disinfect areas that may have blood, stool, or body fluids on them. Use household cleaners and disinfectants. Clean visible dirty surfaces with household cleaners containing soap or detergent. Then, use a household disinfectant. Use a product from [employer] List N: Disinfectants for Coronavirus (COVID-19). Be sure to follow the instructions on the label to ensure safe and effective use of the product. Many products recommend keeping the surface wet with a disinfectant for a certain period of time (look at contact time on the product  label). You may also need to wear personal protective equipment, such as gloves, depending on the directions on the product label. Immediately after disinfecting, wash your hands with soap and water for 20 seconds. For completed guidance on cleaning and disinfecting your home, visit Complete Disinfection Guidance. Take steps to improve ventilation at home Improve ventilation (air flow) at home to help prevent from spreading COVID-19 to other people in your household. Clear out COVID-19 virus particles in the air by opening windows, using air filters, and turning on fans in your home. Use this interactive tool to learn how to improve air flow in your home. When you can be around others after being sick with COVID-19 Deciding when you can be around others is different for different situations. Find out when you can safely end home isolation. For any additional questions about your care, contact your healthcare provider or state or local health department. 07/05/2020 Content source: [facility] ([facility]), Division of Viral Diseases This information is not intended to replace advice given to you by your health care provider. Make sure you discuss any questions you have with your health care provider. Document Revised: 08/18/2020 Document Reviewed: 08/18/2020 Elsevier Patient Education  2022 [employer].     If you have been instructed to have an in-person evaluation today at a local Urgent Care facility, please use the link below. It will take you to a list of all of our available [facility], including address, phone number and hours of operation. Please do not delay care.  [facility]  If you or a family member do not have a primary care provider, use the link below to schedule a visit and establish care. When you choose a [facility] primary care physician or advanced practice provider, you gain a long-term partner in  health. Find a Primary Care Provider  Learn more about [facility]'s in-office and virtual care options: [place] - Get Care Now

## 2024-01-27 NOTE — Progress Notes (Signed)
 Virtual Visit Consent   Alyssa Young, you are scheduled for a virtual visit with a [facility] provider today. Just as with appointments in the office, your consent must be obtained to participate. Your consent will be active for this visit and any virtual visit you may have with one of our providers in the next 365 days. If you have a MyChart account, a copy of this consent can be sent to you electronically.  As this is a virtual visit, video technology does not allow for your provider to perform a traditional examination. This may limit your provider's ability to fully assess your condition. If your provider identifies any concerns that need to be evaluated in person or the need to arrange testing (such as labs, EKG, etc.), we will make arrangements to do so. Although advances in technology are sophisticated, we cannot ensure that it will always work on either your end or our end. If the connection with a video visit is poor, the visit may have to be switched to a telephone visit. With either a video or telephone visit, we are not always able to ensure that we have a secure connection.  By engaging in this virtual visit, you consent to the provision of healthcare and authorize for your insurance to be billed (if applicable) for the services provided during this visit. Depending on your insurance coverage, you may receive a charge related to this service.  I need to obtain your verbal consent now. Are you willing to proceed with your visit today? Alyssa Young has provided verbal consent on 01/27/2024 for a virtual visit (video or telephone). Alyssa Young, [place]  Date: 01/27/2024 5:26 PM   Virtual Visit via Video Note   I, Alyssa Young, connected with  Alyssa Young  ([id number], October 02, 1975) on 01/27/24 at  5:30 PM EDT by a video-enabled telemedicine application and verified that I am speaking with the correct person using two  identifiers.  Location: Patient: Virtual Visit Location Patient: Home Provider: Virtual Visit Location Provider: Home Office   I discussed the limitations of evaluation and management by telemedicine and the availability of in person appointments. The patient expressed understanding and agreed to proceed.    History of Present Illness: Alyssa Young is a 48 y.o. who identifies as a female who was assigned female at birth, and is being seen today for COVID-19. Endorses symptom onset last Friday (4 days ago) with fatigue, low-grade fever (intermittent), post-nasal drip, sore throat. Husband was positive for COVID the week before. So on Saturday when she started with cough and congestion, she tested at home and tested positive. Notes substantial sinus pressure, headache. Denies SOB, chest pain.  Some occasional reflux with laying down, but denies diarrhea and nausea/vomiting.  OTC -- Dayquil/Nyquil, Ibuprofen , Tylenol .  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   History of mitral valve prolapse 10/06/2018   Vitamin D  deficiency 09/30/2017   Mixed hyperlipidemia 07/27/2013   History of PCOS    IBS (irritable bowel syndrome)     Allergies:  Allergies  Allergen Reactions   Allegra [Fexofenadine Hcl]    Medications:  Current Outpatient Medications:    Cholecalciferol (VITAMIN D ) 2000 units tablet, Take 4,000 Units by mouth daily.,  Disp: , Rfl:    famotidine  (PEPCID ) 20 MG tablet, TAKE 1 TABLET BY MOUTH EVERY DAY AT SUPPER TIME, Disp: 90 tablet, Rfl: 1   hyoscyamine  (LEVSIN  SL) 0.125 MG SL tablet, PLACE 1 TABLET (0.125 MG TOTAL) UNDER THE TONGUE EVERY 4 (FOUR) HOURS AS NEEDED., Disp: 540 tablet, Rfl: 1   IBUPROFEN  PO,  Take by mouth as needed., Disp: , Rfl:    Loratadine (CLARITIN PO), Take by mouth as needed., Disp: , Rfl:    naproxen sodium (ALEVE) 220 MG tablet, Take 220 mg by mouth., Disp: , Rfl:    Omega-3 Fatty Acids (FISH OIL PO), Take by mouth. PRN, Disp: , Rfl:    traZODone  (DESYREL ) 50 MG tablet, 1/2-1 tablet for sleep, Disp: 90 tablet, Rfl: 1  Observations/Objective: Patient is well-developed, well-nourished in no acute distress.  Resting comfortably  at home.  Head is normocephalic, atraumatic.  No labored breathing.  Speech is clear and coherent with logical content.  Patient is alert and oriented at baseline.   Assessment and Plan: 1. COVID-19 (Primary)  Milder symptoms. Lower risk of complications - risk score of 1. No indication for antiviral at present time as risk of ADR from medication > likely benefit. Supportive measures, OTC medications and Vitamin regimen reviewed. Concern for secondary sinus infection, so will add on Flonase, Tessalon and Doxycycline per orders.Strict ER precautions reviewed.   Follow Up Instructions: I discussed the assessment and treatment plan with the patient. The patient was provided an opportunity to ask questions and all were answered. The patient agreed with the plan and demonstrated an understanding of the instructions.  A copy of instructions were sent to the patient via MyChart unless otherwise noted below.   The patient was advised to call back or seek an in-person evaluation if the symptoms worsen or if the condition fails to improve as anticipated.    Alyssa Velma Lunger, PA-C

## 2024-10-21 ENCOUNTER — Other Ambulatory Visit

## 2024-10-28 ENCOUNTER — Encounter: Admitting: Family Medicine

## 2024-10-29 ENCOUNTER — Encounter: Admitting: Family Medicine
